# Patient Record
Sex: Male | Born: 1965
Health system: Southern US, Community
[De-identification: ages and names within clinical notes are randomized; demographics above are authoritative.]

## PROBLEM LIST (undated history)

## (undated) DIAGNOSIS — E785 Hyperlipidemia, unspecified: Secondary | ICD-10-CM

## (undated) DIAGNOSIS — I10 Essential (primary) hypertension: Secondary | ICD-10-CM

## (undated) DIAGNOSIS — K219 Gastro-esophageal reflux disease without esophagitis: Secondary | ICD-10-CM

## (undated) DIAGNOSIS — K859 Acute pancreatitis without necrosis or infection, unspecified: Secondary | ICD-10-CM

## (undated) DIAGNOSIS — F1010 Alcohol abuse, uncomplicated: Secondary | ICD-10-CM

## (undated) DIAGNOSIS — N529 Male erectile dysfunction, unspecified: Secondary | ICD-10-CM

## (undated) HISTORY — PX: COLOSTOMY: SHX63

## (undated) HISTORY — PX: NECK SURGERY: SHX720

## (undated) HISTORY — DX: Gastro-esophageal reflux disease without esophagitis: K21.9

## (undated) HISTORY — PX: COLOSTOMY CLOSURE: SHX1381

## (undated) HISTORY — DX: Acute pancreatitis without necrosis or infection, unspecified: K85.90

## (undated) HISTORY — DX: Hyperlipidemia, unspecified: E78.5

## (undated) HISTORY — DX: Male erectile dysfunction, unspecified: N52.9

---

## 2012-11-17 DIAGNOSIS — F1010 Alcohol abuse, uncomplicated: Secondary | ICD-10-CM | POA: Insufficient documentation

## 2013-03-02 DIAGNOSIS — F1490 Cocaine use, unspecified, uncomplicated: Secondary | ICD-10-CM | POA: Insufficient documentation

## 2018-04-22 ENCOUNTER — Encounter: Payer: Self-pay | Admitting: Family Medicine

## 2018-04-22 ENCOUNTER — Ambulatory Visit (INDEPENDENT_AMBULATORY_CARE_PROVIDER_SITE_OTHER): Payer: 59 | Admitting: Family Medicine

## 2018-04-22 VITALS — BP 151/95 | HR 108 | Resp 17 | Ht 74.0 in | Wt 234.0 lb

## 2018-04-22 DIAGNOSIS — Z131 Encounter for screening for diabetes mellitus: Secondary | ICD-10-CM

## 2018-04-22 DIAGNOSIS — N529 Male erectile dysfunction, unspecified: Secondary | ICD-10-CM | POA: Diagnosis not present

## 2018-04-22 DIAGNOSIS — Z7689 Persons encountering health services in other specified circumstances: Secondary | ICD-10-CM

## 2018-04-22 DIAGNOSIS — I10 Essential (primary) hypertension: Secondary | ICD-10-CM

## 2018-04-22 DIAGNOSIS — Z13220 Encounter for screening for lipoid disorders: Secondary | ICD-10-CM

## 2018-04-22 DIAGNOSIS — Z125 Encounter for screening for malignant neoplasm of prostate: Secondary | ICD-10-CM

## 2018-04-22 LAB — POCT URINALYSIS DIP (CLINITEK)
Bilirubin, UA: NEGATIVE
Glucose, UA: NEGATIVE mg/dL
Ketones, POC UA: NEGATIVE mg/dL
Leukocytes, UA: NEGATIVE
Nitrite, UA: NEGATIVE
POC PROTEIN,UA: NEGATIVE
Spec Grav, UA: <=1.005 — AB (ref 1.010–1.025)
Urobilinogen, UA: 0.2 E.U./dL
pH, UA: 6 (ref 5.0–8.0)

## 2018-04-22 MED ORDER — AMLODIPINE BESYLATE 5 MG PO TABS: 5.0000 mg | ORAL_TABLET | Freq: Every day | ORAL | 3 refills | Status: DC

## 2018-04-22 NOTE — Patient Instructions (Addendum)
Thank you for choosing Primary Care at [facility] to be your medical home!    Daniel Silva was seen by Molli Barrows, FNP today.   Daniel Silva's primary care provider is Scot Jun, FNP.   For the best care possible, you should try to see Molli Barrows, FNP-C whenever you come to the clinic.   We look forward to seeing you again soon!  If you have any questions about your visit today, please call us at [phone] or feel free to reach your primary care provider via [place].      Thank you for choosing Primary Care at [facility] for your medical home!    Daniel Silva was seen by Molli Barrows, FNP today.   Daniel Silva's primary care doctor is Scot Jun, FNP.   For the best care possible,  you should try to see Molli Barrows, FNP-C  whenever you come to clinic.   We look forward to seeing you again soon!  If you have any questions about your visit today,  please call us at [phone]  Or feel free to reach your provider via [place].    Hypertension Hypertension, commonly called high blood pressure, is when the force of blood pumping through the arteries is too strong. The arteries are the blood vessels that carry blood from the heart throughout the body. Hypertension forces the heart to work harder to pump blood and may cause arteries to become narrow or stiff. Having untreated or uncontrolled hypertension can cause heart attacks, strokes, kidney disease, and other problems. A blood pressure reading consists of a higher number over a lower number. Ideally, your blood pressure should be below 120/80. The first ("top") number is called the systolic pressure. It is a measure of the pressure in your arteries as your heart beats. The second ("bottom") number is called the diastolic pressure. It is a measure of the pressure in your arteries as the heart relaxes. What are the causes? The cause of this condition is not known. What increases the  risk? Some risk factors for high blood pressure are under your control. Others are not. Factors you can change  Smoking.  Having type 2 diabetes mellitus, high cholesterol, or both.  Not getting enough exercise or physical activity.  Being overweight.  Having too much fat, sugar, calories, or salt (sodium) in your diet.  Drinking too much alcohol. Factors that are difficult or impossible to change  Having chronic kidney disease.  Having a family history of high blood pressure.  Age. Risk increases with age.  Race. You may be at higher risk if you are African-American.  Gender. Men are at higher risk than women before age 75. After age 21, women are at higher risk than men.  Having obstructive sleep apnea.  Stress. What are the signs or symptoms? Extremely high blood pressure (hypertensive crisis) may cause:  Headache.  Anxiety.  Shortness of breath.  Nosebleed.  Nausea and vomiting.  Severe chest pain.  Jerky movements you cannot control (seizures). How is this diagnosed? This condition is diagnosed by measuring your blood pressure while you are seated, with your arm resting on a surface. The cuff of the blood pressure monitor will be placed directly against the skin of your upper arm at the level of your heart. It should be measured at least twice using the same arm. Certain conditions can cause a difference in blood pressure between your right and left arms. Certain factors can cause blood pressure readings to  be lower or higher than normal (elevated) for a short period of time:  When your blood pressure is higher when you are in a health care provider's office than when you are at home, this is called white coat hypertension. Most people with this condition do not need medicines.  When your blood pressure is higher at home than when you are in a health care provider's office, this is called masked hypertension. Most people with this condition may need medicines  to control blood pressure. If you have a high blood pressure reading during one visit or you have normal blood pressure with other risk factors:  You may be asked to return on a different day to have your blood pressure checked again.  You may be asked to monitor your blood pressure at home for 1 week or longer. If you are diagnosed with hypertension, you may have other blood or imaging tests to help your health care provider understand your overall risk for other conditions. How is this treated? This condition is treated by making healthy lifestyle changes, such as eating healthy foods, exercising more, and reducing your alcohol intake. Your health care provider may prescribe medicine if lifestyle changes are not enough to get your blood pressure under control, and if:  Your systolic blood pressure is above 130.  Your diastolic blood pressure is above 80. Your personal target blood pressure may vary depending on your medical conditions, your age, and other factors. Follow these instructions at home: Eating and drinking   Eat a diet that is high in fiber and potassium, and low in sodium, added sugar, and fat. An example eating plan is called the DASH (Dietary Approaches to Stop Hypertension) diet. To eat this way: ? Eat plenty of fresh fruits and vegetables. Try to fill half of your plate at each meal with fruits and vegetables. ? Eat whole grains, such as whole wheat pasta, brown rice, or whole grain bread. Fill about one quarter of your plate with whole grains. ? Eat or drink low-fat dairy products, such as skim milk or low-fat yogurt. ? Avoid fatty cuts of meat, processed or cured meats, and poultry with skin. Fill about one quarter of your plate with lean proteins, such as fish, chicken without skin, beans, eggs, and tofu. ? Avoid premade and processed foods. These tend to be higher in sodium, added sugar, and fat.  Reduce your daily sodium intake. Most people with hypertension should  eat less than 1,500 mg of sodium a day.  Limit alcohol intake to no more than 1 drink a day for nonpregnant women and 2 drinks a day for men. One drink equals 12 oz of beer, 5 oz of wine, or 1 oz of hard liquor. Lifestyle   Work with your health care provider to maintain a healthy body weight or to lose weight. Ask what an ideal weight is for you.  Get at least 30 minutes of exercise that causes your heart to beat faster (aerobic exercise) most days of the week. Activities may include walking, swimming, or biking.  Include exercise to strengthen your muscles (resistance exercise), such as pilates or lifting weights, as part of your weekly exercise routine. Try to do these types of exercises for 30 minutes at least 3 days a week.  Do not use any products that contain nicotine or tobacco, such as cigarettes and e-cigarettes. If you need help quitting, ask your health care provider.  Monitor your blood pressure at home as told by your health  care provider.  Keep all follow-up visits as told by your health care provider. This is important. Medicines  Take over-the-counter and prescription medicines only as told by your health care provider. Follow directions carefully. Blood pressure medicines must be taken as prescribed.  Do not skip doses of blood pressure medicine. Doing this puts you at risk for problems and can make the medicine less effective.  Ask your health care provider about side effects or reactions to medicines that you should watch for. Contact a health care provider if:  You think you are having a reaction to a medicine you are taking.  You have headaches that keep coming back (recurring).  You feel dizzy.  You have swelling in your ankles.  You have trouble with your vision. Get help right away if:  You develop a severe headache or confusion.  You have unusual weakness or numbness.  You feel faint.  You have severe pain in your chest or abdomen.  You vomit  repeatedly.  You have trouble breathing. Summary  Hypertension is when the force of blood pumping through your arteries is too strong. If this condition is not controlled, it may put you at risk for serious complications.  Your personal target blood pressure may vary depending on your medical conditions, your age, and other factors. For most people, a normal blood pressure is less than 120/80.  Hypertension is treated with lifestyle changes, medicines, or a combination of both. Lifestyle changes include weight loss, eating a healthy, low-sodium diet, exercising more, and limiting alcohol. This information is not intended to replace advice given to you by your health care provider. Make sure you discuss any questions you have with your health care provider. Document Released: 02/25/2005 Document Revised: 01/24/2016 Document Reviewed: 01/24/2016 Elsevier Interactive Patient Education  2019 [employer].    Once blood pressure has been well controlled we can discuss starting ED medication if this problem persist in spite of regaining good blood pressure control.  Erectile Dysfunction Erectile dysfunction (ED) is the inability to get or keep an erection in order to have sexual intercourse. Erectile dysfunction may include:  Inability to get an erection.  Lack of enough hardness of the erection to allow penetration.  Loss of the erection before sex is finished. What are the causes? This condition may be caused by:  Certain medicines, such as: ? Pain relievers. ? Antihistamines. ? Antidepressants. ? Blood pressure medicines. ? Water pills (diuretics). ? Ulcer medicines. ? Muscle relaxants. ? Drugs.  Excessive drinking.  Psychological causes, such as: ? Anxiety. ? Depression. ? Sadness. ? Exhaustion. ? Performance fear. ? Stress.  Physical causes, such as: ? Artery problems. This may include diabetes, smoking, liver disease, or atherosclerosis. ? High blood  pressure. ? Hormonal problems, such as low testosterone. ? Obesity. ? Nerve problems. This may include back or pelvic injuries, diabetes mellitus, multiple sclerosis, or Parkinson disease. What are the signs or symptoms? Symptoms of this condition include:  Inability to get an erection.  Lack of enough hardness of the erection to allow penetration.  Loss of the erection before sex is finished.  Normal erections at some times, but with frequent unsatisfactory episodes.  Low sexual satisfaction in either partner due to erection problems.  A curved penis occurring with erection. The curve may cause pain or the penis may be too curved to allow for intercourse.  Never having nighttime erections. How is this diagnosed? This condition is often diagnosed by:  Performing a physical exam to find  other diseases or specific problems with the penis.  Asking you detailed questions about the problem.  Performing blood tests to check for diabetes mellitus or to measure hormone levels.  Performing other tests to check for underlying health conditions.  Performing an ultrasound exam to check for scarring.  Performing a test to check blood flow to the penis.  Doing a sleep study at home to measure nighttime erections. How is this treated? This condition may be treated by:  Medicine taken by mouth to help you achieve an erection (oral medicine).  Hormone replacement therapy to replace low testosterone levels.  Medicine that is injected into the penis. Your health care provider may instruct you how to give yourself these injections at home.  Vacuum pump. This is a pump with a ring on it. The pump and ring are placed on the penis and used to create pressure that helps the penis become erect.  Penile implant surgery. In this procedure, you may receive: ? An inflatable implant. This consists of cylinders, a pump, and a reservoir. The cylinders can be inflated with a fluid that helps to create  an erection, and they can be deflated after intercourse. ? A semi-rigid implant. This consists of two silicone rubber rods. The rods provide some rigidity. They are also flexible, so the penis can both curve downward in its normal position and become straight for sexual intercourse.  Blood vessel surgery, to improve blood flow to the penis. During this procedure, a blood vessel from a different part of the body is placed into the penis to allow blood to flow around (bypass) damaged or blocked blood vessels.  Lifestyle changes, such as exercising more, losing weight, and quitting smoking. Follow these instructions at home: Medicines   Take over-the-counter and prescription medicines only as told by your health care provider. Do not increase the dosage without first discussing it with your health care provider.  If you are using self-injections, perform injections as directed by your health care provider. Make sure to avoid any veins that are on the surface of the penis. After giving an injection, apply pressure to the injection site for 5 minutes. General instructions  Exercise regularly, as directed by your health care provider. Work with your health care provider to lose weight, if needed.  Do not use any products that contain nicotine or tobacco, such as cigarettes and e-cigarettes. If you need help quitting, ask your health care provider.  Before using a vacuum pump, read the instructions that come with the pump and discuss any questions with your health care provider.  Keep all follow-up visits as told by your health care provider. This is important. Contact a health care provider if:  You feel nauseous.  You vomit. Get help right away if:  You are taking oral or injectable medicines and you have an erection that lasts longer than 4 hours. If your health care provider is unavailable, go to the nearest emergency room for evaluation. An erection that lasts much longer than 4 hours can  result in permanent damage to your penis.  You have severe pain in your groin or abdomen.  You develop redness or severe swelling of your penis.  You have redness spreading up into your groin or lower abdomen.  You are unable to urinate.  You experience chest pain or a rapid heart beat (palpitations) after taking oral medicines. Summary  Erectile dysfunction (ED) is the inability to get or keep an erection during sexual intercourse. This problem can  usually be treated successfully.  This condition is diagnosed based on a physical exam, your symptoms, and tests to determine the cause. Treatment varies depending on the cause, and may include medicines, hormone therapy, surgery, or vacuum pump.  You may need follow-up visits to make sure that you are using your medicines or devices correctly.  Get help right away if you are taking or injecting medicines and you have an erection that lasts longer than 4 hours. This information is not intended to replace advice given to you by your health care provider. Make sure you discuss any questions you have with your health care provider. Document Released: 02/23/2000 Document Revised: 03/13/2016 Document Reviewed: 03/13/2016 Elsevier Interactive Patient Education  2019 [employer].

## 2018-04-22 NOTE — Progress Notes (Signed)
Daniel Silva, is a 53 y.o. male  KZL:[id number]  [id number]  DOB - June 24, 1965  CC:  Chief Complaint  Patient presents with  . Establish Care  . Hypertension    went to the dentist to get work done & was told that his BP was high. no past hx of HTN       HPI: Daniel Silva is a 53 y.o. male is here today to establish care and evaluation of new onset hypertension.    Hypertension new Patient presents for evaluation of elevated blood pressure without a diagnosis of hypertension.  Patient went to the dentist to have teeth extracted and serial readings of blood pressure was elevated greater than 140/90.  Procedure was deferred until patient followed up here for further evaluation. Patient is a current light smoker drinks occasionally and active of routine physical activity.  Current BMI Body mass index is 30.04 kg/m.  He is an active routine physical activity. He also has a concern regarding erectile dysfunction.  This problem started approximately 3 months prior.  He has no known history of peripheral vascular disease or diabetes.  He has not had any formal follow-up with a primary care provider previously.  Current medications:No current outpatient medications on file.   Pertinent family medical history: family history includes Hypertension in his mother.   No Known Allergies  Social History   Socioeconomic History  . Marital status: Single    Spouse name: Not on file  . Number of children: Not on file  . Years of education: Not on file  . Highest education level: Not on file  Occupational History  . Not on file  Social Needs  . Financial resource strain: Not on file  . Food insecurity:    Worry: Not on file    Inability: Not on file  . Transportation needs:    Medical: Not on file    Non-medical: Not on file  Tobacco Use  . Smoking status: Current Every Day Smoker  . Smokeless tobacco: Never Used  Substance and Sexual Activity  . Alcohol use: Yes  . Drug use: Not on  file  . Sexual activity: Yes    Partners: Female    Birth control/protection: Post-menopausal  Lifestyle  . Physical activity:    Days per week: Not on file    Minutes per session: Not on file  . Stress: Not on file  Relationships  . Social connections:    Talks on phone: Not on file    Gets together: Not on file    Attends religious service: Not on file    Active member of club or organization: Not on file    Attends meetings of clubs or organizations: Not on file    Relationship status: Not on file  . Intimate partner violence:    Fear of current or ex partner: Not on file    Emotionally abused: Not on file    Physically abused: Not on file    Forced sexual activity: Not on file  Other Topics Concern  . Not on file  Social History Narrative  . Not on file    Review of Systems: Pertinent negatives listed in HPI Objective:   Vitals:   04/22/18 0918  BP: (!) 151/95  Pulse: (!) 108  Resp: 17  SpO2: 96%    BP Readings from Last 3 Encounters:  04/22/18 (!) 151/95    Filed Weights   04/22/18 0918  Weight: 234 lb (106.1 kg)  Physical Exam: Constitutional: Patient appears well-developed and well-nourished. No distress. HENT: Normocephalic, atraumatic, External right and left ear normal.  Eyes: Conjunctivae and EOM are normal. PERRLA, no scleral icterus. Neck: Normal ROM. Neck supple. No JVD. No tracheal deviation. No thyromegaly. CVS: RRR, S1/S2 +, no murmurs, no gallops, no carotid bruit.  Pulmonary: Effort and breath sounds normal, no stridor, rhonchi, wheezes, rales.  Abdominal: Soft. BS +, no distension, tenderness, rebound or guarding.  Musculoskeletal: Normal range of motion. No edema and no tenderness.  Neuro: Alert. Normal muscle tone coordination. Normal gait. BUE and BLE strength 5/5. Bilateral hand grips symmetrical. Skin: Skin is warm and dry. No rash noted. Not diaphoretic. No erythema. No pallor. Psychiatric: Normal mood and affect. Behavior,  judgment, thought content normal.   Assessment and plan:  1. Encounter to establish care 2. Essential hypertension, New  Will trial amlodipine 5 mg once daily Return in 4 weeks for a blood pressure check   Checking:  - Comprehensive metabolic panel - TSH - CBC with Differential - POCT URINALYSIS DIP (CLINITEK)  3. Screening for diabetes mellitus - Hemoglobin A1c  4. Screening, lipid - Lipid panel; Future  5. Erectile dysfunction, unspecified erectile dysfunction type -Will postpone treatment and evaluation as achieving BP control is most prevalent today. Patient will follow-up in 4 weeks to discuss further   6. Screening PSA (prostate specific antigen) - PSA   Return in about 4 weeks (around 05/20/2018) for Hypertension and Erectile Dysfunction Follow-up .   The patient was given clear instructions to go to ER or return to medical center if symptoms don't improve, worsen or new problems develop. The patient verbalized understanding. The patient was advised  to call and obtain lab results if they haven't heard anything from out office within 7-10 business days.  Molli Barrows, FNP-C Primary Care at [facility] [address] [place] [address] [phone]: [phone]    This note has been created with Dragon speech recognition software and Engineer, materials. Any transcriptional errors are unintentional.

## 2018-04-23 ENCOUNTER — Ambulatory Visit: Payer: 59

## 2018-04-23 LAB — CBC WITH DIFFERENTIAL/PLATELET
Basophils Absolute: 0 10*3/uL (ref 0.0–0.2)
Basos: 0 %
EOS (ABSOLUTE): 0.5 10*3/uL — ABNORMAL HIGH (ref 0.0–0.4)
Eos: 6 %
Hematocrit: 41.3 % (ref 37.5–51.0)
Hemoglobin: 13.9 g/dL (ref 13.0–17.7)
IMMATURE GRANULOCYTES: 0 %
Immature Grans (Abs): 0 10*3/uL (ref 0.0–0.1)
Lymphocytes Absolute: 3.5 10*3/uL — ABNORMAL HIGH (ref 0.7–3.1)
Lymphs: 40 %
MCH: 30.5 pg (ref 26.6–33.0)
MCHC: 33.7 g/dL (ref 31.5–35.7)
MCV: 91 fL (ref 79–97)
MONOS ABS: 0.8 10*3/uL (ref 0.1–0.9)
Monocytes: 9 %
Neutrophils Absolute: 4 10*3/uL (ref 1.4–7.0)
Neutrophils: 45 %
Platelets: 258 10*3/uL (ref 150–450)
RBC: 4.56 x10E6/uL (ref 4.14–5.80)
RDW: 12.8 % (ref 11.6–15.4)
WBC: 8.9 10*3/uL (ref 3.4–10.8)

## 2018-04-23 LAB — COMPREHENSIVE METABOLIC PANEL
A/G RATIO: 1.6 (ref 1.2–2.2)
ALT: 37 IU/L (ref 0–44)
AST: 32 IU/L (ref 0–40)
Albumin: 4.4 g/dL (ref 3.8–4.9)
Alkaline Phosphatase: 91 IU/L (ref 39–117)
BUN/Creatinine Ratio: 9 (ref 9–20)
BUN: 9 mg/dL (ref 6–24)
Bilirubin Total: 0.4 mg/dL (ref 0.0–1.2)
CO2: 16 mmol/L — ABNORMAL LOW (ref 20–29)
Calcium: 9.8 mg/dL (ref 8.7–10.2)
Chloride: 105 mmol/L (ref 96–106)
Creatinine, Ser: 1.05 mg/dL (ref 0.76–1.27)
GFR calc Af Amer: 94 mL/min/{1.73_m2} (ref >59)
GFR calc non Af Amer: 81 mL/min/{1.73_m2} (ref >59)
Globulin, Total: 2.7 g/dL (ref 1.5–4.5)
Glucose: 120 mg/dL — ABNORMAL HIGH (ref 65–99)
POTASSIUM: 3.6 mmol/L (ref 3.5–5.2)
Sodium: 143 mmol/L (ref 134–144)
Total Protein: 7.1 g/dL (ref 6.0–8.5)

## 2018-04-23 LAB — PSA: Prostate Specific Ag, Serum: 1.2 ng/mL (ref 0.0–4.0)

## 2018-04-23 LAB — TSH: TSH: 1.93 u[IU]/mL (ref 0.450–4.500)

## 2018-04-23 LAB — HEMOGLOBIN A1C
Est. average glucose Bld gHb Est-mCnc: 108 mg/dL
Hgb A1c MFr Bld: 5.4 % (ref 4.8–5.6)

## 2018-04-27 ENCOUNTER — Ambulatory Visit: Payer: 59

## 2018-04-30 ENCOUNTER — Ambulatory Visit (INDEPENDENT_AMBULATORY_CARE_PROVIDER_SITE_OTHER): Payer: 59

## 2018-04-30 VITALS — BP 128/87 | HR 90

## 2018-04-30 DIAGNOSIS — I10 Essential (primary) hypertension: Secondary | ICD-10-CM

## 2018-04-30 DIAGNOSIS — Z13220 Encounter for screening for lipoid disorders: Secondary | ICD-10-CM | POA: Diagnosis not present

## 2018-04-30 NOTE — Progress Notes (Signed)
Patient here for BP check. BP 128/87. Pulse 90. Spoke with provider. She states to continue current medication & follow up with her in 4 weeks. KWalker, CMA.

## 2018-05-01 LAB — LIPID PANEL
Chol/HDL Ratio: 5.6 ratio — ABNORMAL HIGH (ref 0.0–5.0)
Cholesterol, Total: 195 mg/dL (ref 100–199)
HDL: 35 mg/dL — ABNORMAL LOW (ref >39)
Triglycerides: 420 mg/dL — ABNORMAL HIGH (ref 0–149)

## 2018-05-01 NOTE — Progress Notes (Signed)
Patient notified of results & recommendations during appointment for his BP check. Expressed understanding.

## 2018-05-04 ENCOUNTER — Other Ambulatory Visit: Payer: Self-pay | Admitting: Family Medicine

## 2018-05-04 MED ORDER — ATORVASTATIN CALCIUM 10 MG PO TABS: 10.0000 mg | ORAL_TABLET | Freq: Every day | ORAL | 3 refills | Status: DC

## 2018-05-04 NOTE — Telephone Encounter (Signed)
Contact patient to advise recent cholesterol panel indicates abnormally elevated triglycerides and low good cholesterol (HDL) level. These levels given his recent diagnosis of hypertension,  increases his risk of a cardiovascular event, therefore I am placing him on statin therapy. Atorvastatin 10 mg once daily with dinner at 6 pm. Will need to see him back in office in 8 weeks to repeat lipids, CMP, and hypertension follow-up.

## 2018-05-04 NOTE — Telephone Encounter (Signed)
Patient called stating that last time he was in to see PCP she was going to give him something for his erectile dysfunction, patient would like a medication sent to the pharmacy.

## 2018-05-06 NOTE — Telephone Encounter (Signed)
Left voice mail to call back 

## 2018-05-28 ENCOUNTER — Ambulatory Visit (INDEPENDENT_AMBULATORY_CARE_PROVIDER_SITE_OTHER): Payer: 59 | Admitting: Family Medicine

## 2018-05-28 ENCOUNTER — Other Ambulatory Visit: Payer: Self-pay

## 2018-05-28 ENCOUNTER — Encounter: Payer: Self-pay | Admitting: Family Medicine

## 2018-05-28 VITALS — BP 148/107 | HR 100 | Temp 98.6°F | Resp 16 | Ht 74.0 in | Wt 233.6 lb

## 2018-05-28 DIAGNOSIS — R7989 Other specified abnormal findings of blood chemistry: Secondary | ICD-10-CM

## 2018-05-28 DIAGNOSIS — E291 Testicular hypofunction: Secondary | ICD-10-CM

## 2018-05-28 DIAGNOSIS — N529 Male erectile dysfunction, unspecified: Secondary | ICD-10-CM

## 2018-05-28 DIAGNOSIS — I10 Essential (primary) hypertension: Secondary | ICD-10-CM | POA: Diagnosis not present

## 2018-05-28 LAB — POCT URINALYSIS DIP (CLINITEK)
BILIRUBIN UA: NEGATIVE mg/dL
Bilirubin, UA: NEGATIVE
Glucose, UA: NEGATIVE mg/dL
Leukocytes, UA: NEGATIVE
Nitrite, UA: NEGATIVE
POC PROTEIN,UA: NEGATIVE
SPEC GRAV UA: 1.025 (ref 1.010–1.025)
Urobilinogen, UA: 0.2 E.U./dL
pH, UA: 7 (ref 5.0–8.0)

## 2018-05-28 MED ORDER — AMLODIPINE BESYLATE 5 MG PO TABS: 10.0000 mg | ORAL_TABLET | Freq: Every day | ORAL | 3 refills | Status: DC

## 2018-05-28 MED ORDER — SILDENAFIL CITRATE 100 MG PO TABS: 50.0000 mg | ORAL_TABLET | Freq: Every day | ORAL | 2 refills | Status: DC | PRN

## 2018-05-28 NOTE — Patient Instructions (Signed)
I have increased your amlodipine to 10 mg once daily your blood pressure is elevated today. Education added sildenafil 100 mg take half a tab 1 hour prior to sexual intercourse. You will be notified of any abnormal lab values.     Erectile Dysfunction Erectile dysfunction (ED) is the inability to get or keep an erection in order to have sexual intercourse. Erectile dysfunction may include:  Inability to get an erection.  Lack of enough hardness of the erection to allow penetration.  Loss of the erection before sex is finished. What are the causes? This condition may be caused by:  Certain medicines, such as: ? Pain relievers. ? Antihistamines. ? Antidepressants. ? Blood pressure medicines. ? Water pills (diuretics). ? Ulcer medicines. ? Muscle relaxants. ? Drugs.  Excessive drinking.  Psychological causes, such as: ? Anxiety. ? Depression. ? Sadness. ? Exhaustion. ? Performance fear. ? Stress.  Physical causes, such as: ? Artery problems. This may include diabetes, smoking, liver disease, or atherosclerosis. ? High blood pressure. ? Hormonal problems, such as low testosterone. ? Obesity. ? Nerve problems. This may include back or pelvic injuries, diabetes mellitus, multiple sclerosis, or Parkinson disease. What are the signs or symptoms? Symptoms of this condition include:  Inability to get an erection.  Lack of enough hardness of the erection to allow penetration.  Loss of the erection before sex is finished.  Normal erections at some times, but with frequent unsatisfactory episodes.  Low sexual satisfaction in either partner due to erection problems.  A curved penis occurring with erection. The curve may cause pain or the penis may be too curved to allow for intercourse.  Never having nighttime erections. How is this diagnosed? This condition is often diagnosed by:  Performing a physical exam to find other diseases or specific problems with the penis.   Asking you detailed questions about the problem.  Performing blood tests to check for diabetes mellitus or to measure hormone levels.  Performing other tests to check for underlying health conditions.  Performing an ultrasound exam to check for scarring.  Performing a test to check blood flow to the penis.  Doing a sleep study at home to measure nighttime erections. How is this treated? This condition may be treated by:  Medicine taken by mouth to help you achieve an erection (oral medicine).  Hormone replacement therapy to replace low testosterone levels.  Medicine that is injected into the penis. Your health care provider may instruct you how to give yourself these injections at home.  Vacuum pump. This is a pump with a ring on it. The pump and ring are placed on the penis and used to create pressure that helps the penis become erect.  Penile implant surgery. In this procedure, you may receive: ? An inflatable implant. This consists of cylinders, a pump, and a reservoir. The cylinders can be inflated with a fluid that helps to create an erection, and they can be deflated after intercourse. ? A semi-rigid implant. This consists of two silicone rubber rods. The rods provide some rigidity. They are also flexible, so the penis can both curve downward in its normal position and become straight for sexual intercourse.  Blood vessel surgery, to improve blood flow to the penis. During this procedure, a blood vessel from a different part of the body is placed into the penis to allow blood to flow around (bypass) damaged or blocked blood vessels.  Lifestyle changes, such as exercising more, losing weight, and quitting smoking. Follow these instructions  at home: Medicines   Take over-the-counter and prescription medicines only as told by your health care provider. Do not increase the dosage without first discussing it with your health care provider.  If you are using self-injections,  perform injections as directed by your health care provider. Make sure to avoid any veins that are on the surface of the penis. After giving an injection, apply pressure to the injection site for 5 minutes. General instructions  Exercise regularly, as directed by your health care provider. Work with your health care provider to lose weight, if needed.  Do not use any products that contain nicotine or tobacco, such as cigarettes and e-cigarettes. If you need help quitting, ask your health care provider.  Before using a vacuum pump, read the instructions that come with the pump and discuss any questions with your health care provider.  Keep all follow-up visits as told by your health care provider. This is important. Contact a health care provider if:  You feel nauseous.  You vomit. Get help right away if:  You are taking oral or injectable medicines and you have an erection that lasts longer than 4 hours. If your health care provider is unavailable, go to the nearest emergency room for evaluation. An erection that lasts much longer than 4 hours can result in permanent damage to your penis.  You have severe pain in your groin or abdomen.  You develop redness or severe swelling of your penis.  You have redness spreading up into your groin or lower abdomen.  You are unable to urinate.  You experience chest pain or a rapid heart beat (palpitations) after taking oral medicines. Summary  Erectile dysfunction (ED) is the inability to get or keep an erection during sexual intercourse. This problem can usually be treated successfully.  This condition is diagnosed based on a physical exam, your symptoms, and tests to determine the cause. Treatment varies depending on the cause, and may include medicines, hormone therapy, surgery, or vacuum pump.  You may need follow-up visits to make sure that you are using your medicines or devices correctly.  Get help right away if you are taking or  injecting medicines and you have an erection that lasts longer than 4 hours. This information is not intended to replace advice given to you by your health care provider. Make sure you discuss any questions you have with your health care provider. Document Released: 02/23/2000 Document Revised: 03/13/2016 Document Reviewed: 03/13/2016 Elsevier Interactive Patient Education  2019 [employer].

## 2018-05-28 NOTE — Progress Notes (Signed)
Patient ID: Daniel Silva, male    DOB: Jun 04, 1965, 53 y.o.   MRN: [id number]  PCP: Scot Jun, FNP  Chief Complaint  Patient presents with  . Follow-up    Subjective:  HPI  Daniel Silva is a 53 y.o. male presents for evaluation of erectile dysfunction.  Daniel Silva has Erectile dysfunction; Essential hypertension; and Low testosterone in male.  Newly diagnosed with hypertension, Daniel Silva complains of over 6 months of ED symptoms. He reports difficulty maintaining an erection only. He is soon to be married and would like to start ED medications. This has never been a problem previously. He has a normal PSA level. No prior testerone level evaluation. He is a smoker. He doesn't have diabetes. Reports compliance with blood pressure medications. Social History   Socioeconomic History  . Marital status: Single    Spouse name: Not on file  . Number of children: Not on file  . Years of education: Not on file  . Highest education level: Not on file  Occupational History  . Not on file  Social Needs  . Financial resource strain: Not on file  . Food insecurity:    Worry: Not on file    Inability: Not on file  . Transportation needs:    Medical: Not on file    Non-medical: Not on file  Tobacco Use  . Smoking status: Current Every Day Smoker    Types: Cigarettes  . Smokeless tobacco: Never Used  . Tobacco comment: 1 cig/day  Substance and Sexual Activity  . Alcohol use: Yes  . Drug use: Not Currently  . Sexual activity: Yes    Partners: Female  Lifestyle  . Physical activity:    Days per week: Not on file    Minutes per session: Not on file  . Stress: Not on file  Relationships  . Social connections:    Talks on phone: Not on file    Gets together: Not on file    Attends religious service: Not on file    Active member of club or organization: Not on file    Attends meetings of clubs or organizations: Not on file    Relationship status: Not on file  . Intimate  partner violence:    Fear of current or ex partner: Not on file    Emotionally abused: Not on file    Physically abused: Not on file    Forced sexual activity: Not on file  Other Topics Concern  . Not on file  Social History Narrative  . Not on file    Family History  Problem Relation Age of Onset  . Hypertension Mother   . Diabetes Neg Hx   . Hyperlipidemia Neg Hx   . Heart disease Neg Hx      Review of Systems Pertinent negatives listed in HPI No Known Allergies  Prior to Admission medications   Medication Sig Start Date End Date Taking? Authorizing Provider  amLODipine (NORVASC) 5 MG tablet Take 2 tablets (10 mg total) by mouth daily. 05/28/18  Yes Scot Jun, FNP  atorvastatin (LIPITOR) 10 MG tablet Take 1 tablet (10 mg total) by mouth daily. 05/04/18  Yes Scot Jun, FNP  ofloxacin (OCUFLOX) 0.3 % ophthalmic solution Place 1 drop into both eyes 4 (four) times daily.   Yes [provider]  VALACYCLOVIR HCL PO Take by mouth.   Yes [provider]  sildenafil (VIAGRA) 100 MG tablet Take 0.5 tablets (50 mg total) by mouth daily as needed  for erectile dysfunction. 05/28/18   Scot Jun, FNP    Past Medical, Surgical Family and Social History reviewed and updated.    Objective:   Today's Vitals   05/28/18 0900  BP: (!) 148/107  Pulse: 100  Resp: 16  Temp: 98.6 F (37 C)  TempSrc: Oral  SpO2: 96%  Weight: 233 lb 9.6 oz (106 kg)  Height: 6\' 2"  (1.88 m)  PainSc: 0-No pain    BP Readings from Last 3 Encounters:  05/28/18 (!) 148/107  04/30/18 128/87  04/22/18 (!) 151/95    Filed Weights   05/28/18 0900  Weight: 233 lb 9.6 oz (106 kg)       Physical Exam General appearance: alert, well developed, well nourished, cooperative and in no distress Head: Normocephalic, without obvious abnormality, atraumatic Respiratory: Respirations even and unlabored, normal respiratory rate Heart: rate and rhythm normal. No gallop or  murmurs noted on exam  Abdomen: BS +, no distention, no rebound tenderness, or no mass Extremities: No gross deformities Skin: Skin color, texture, turgor normal. No rashes seen  Psych: Appropriate mood and affect. Neurologic: Alert, oriented to person, place, and time, thought content appropriate. No results found for: POCGLU  Lab Results  Component Value Date   HGBA1C 5.4 04/22/2018    Assessment & Plan:  1. Erectile dysfunction, unspecified erectile dysfunction type Trial sildenafil 50 mg once daily as needed prior to sexual activity. - Testosterone Free, Profile I  2. Essential hypertension We have discussed target BP range and blood pressure goal. I have advised patient to check BP regularly and to call us back or report to clinic if the numbers are consistently higher than 140/90. We discussed the importance of compliance with medical therapy and DASH diet recommended, consequences of uncontrolled hypertension discussed.  - continue current BP medications -Increase amlodipine 10 mg once daily  - POCT URINALYSIS DIP (CLINITEK)    Molli Barrows, FNP Primary Care at [facility] [address], [place] [phone]: [phone]

## 2018-05-28 NOTE — Progress Notes (Deleted)
Follow up - HTN                    Discuss ED

## 2018-05-28 NOTE — Progress Notes (Deleted)
Patient ID: Daniel Silva, male    DOB: September 15, 1965, 53 y.o.   MRN: [id number]  PCP: Scot Jun, FNP  No chief complaint on file.   Subjective:  HPI  Daniel Silva is a 53 y.o. male presents for evaluation erectile dysfunction.   Erectile Dysfunction:  Patient complains of erectile dysfunction.  Onset of dysfunction was {0-10:33138} {units:11} ago and was {onset:31634} in onset.  Patient states the nature of difficulty is {ED difficulty:16474}. Full erections occur {erections:16472}. Partial erections occur {erections:16472}. Libido {is/not:9024} affected. Risk factors for ED include {risk factors ED:17914}. Patient denies history of {risk factors ED:17914}. Patient's expectations as to sexual function ***.  Patient's description of relationship w/partner ***.  Previous treatment of ED includes ***.      Today's visit:  Social History   Socioeconomic History  . Marital status: Single    Spouse name: Not on file  . Number of children: Not on file  . Years of education: Not on file  . Highest education level: Not on file  Occupational History  . Not on file  Social Needs  . Financial resource strain: Not on file  . Food insecurity:    Worry: Not on file    Inability: Not on file  . Transportation needs:    Medical: Not on file    Non-medical: Not on file  Tobacco Use  . Smoking status: Current Every Day Smoker  . Smokeless tobacco: Never Used  Substance and Sexual Activity  . Alcohol use: Yes  . Drug use: Not on file  . Sexual activity: Yes    Partners: Female    Birth control/protection: Post-menopausal  Lifestyle  . Physical activity:    Days per week: Not on file    Minutes per session: Not on file  . Stress: Not on file  Relationships  . Social connections:    Talks on phone: Not on file    Gets together: Not on file    Attends religious service: Not on file    Active member of club or organization: Not on file    Attends meetings of clubs or  organizations: Not on file    Relationship status: Not on file  . Intimate partner violence:    Fear of current or ex partner: Not on file    Emotionally abused: Not on file    Physically abused: Not on file    Forced sexual activity: Not on file  Other Topics Concern  . Not on file  Social History Narrative  . Not on file    Family History  Problem Relation Age of Onset  . Hypertension Mother   . Diabetes Neg Hx   . Hyperlipidemia Neg Hx   . Heart disease Neg Hx      Review of Systems  No Known Allergies  Prior to Admission medications   Medication Sig Start Date End Date Taking? Authorizing Provider  amLODipine (NORVASC) 5 MG tablet Take 1 tablet (5 mg total) by mouth daily. 04/22/18   Scot Jun, FNP  atorvastatin (LIPITOR) 10 MG tablet Take 1 tablet (10 mg total) by mouth daily. 05/04/18   Scot Jun, FNP    Past Medical, Surgical Family and Social History reviewed and updated.    Objective:  There were no vitals filed for this visit.  BP Readings from Last 3 Encounters:  04/30/18 128/87  04/22/18 (!) 151/95    There were no vitals filed for this visit.     Physical Exam  General appearance: alert, well developed, well nourished, cooperative and in no distress Head: Normocephalic, without obvious abnormality, atraumatic Respiratory: Respirations even and unlabored, normal respiratory rate Heart: rate and rhythm normal. No gallop or murmurs noted on exam  Extremities: No gross deformities Skin: Skin color, texture, turgor normal. No rashes seen  Psych: Appropriate mood and affect. Neurologic: Mental status: Alert, oriented to person, place, and time, thought content appropriate.  No results found for: POCGLU  Lab Results  Component Value Date   HGBA1C 5.4 04/22/2018            Assessment & Plan:  1. Erectile dysfunction, unspecified erectile dysfunction type ***   2. Hypertension follow-up      Daniel Barrows, FNP Primary  Care at [facility] [address], [place] [phone]: [phone]

## 2018-05-29 LAB — TESTOSTERONE FREE, PROFILE I
Albumin: 4.4 g/dL (ref 3.8–4.9)
Sex Hormone Binding: 17.3 nmol/L — ABNORMAL LOW (ref 19.3–76.4)
Testost., Free, Calc: 23.8 pg/mL — ABNORMAL LOW (ref 35.8–168.2)
Testosterone: 95 ng/dL — ABNORMAL LOW (ref 264–916)

## 2018-05-31 ENCOUNTER — Encounter: Payer: Self-pay | Admitting: Family Medicine

## 2018-05-31 DIAGNOSIS — R7989 Other specified abnormal findings of blood chemistry: Secondary | ICD-10-CM | POA: Insufficient documentation

## 2018-05-31 DIAGNOSIS — I10 Essential (primary) hypertension: Secondary | ICD-10-CM | POA: Insufficient documentation

## 2018-05-31 DIAGNOSIS — N529 Male erectile dysfunction, unspecified: Secondary | ICD-10-CM | POA: Insufficient documentation

## 2018-06-01 ENCOUNTER — Other Ambulatory Visit: Payer: Self-pay | Admitting: Family Medicine

## 2018-06-01 NOTE — Telephone Encounter (Signed)
Contact patient to advise that recent testosterone labs indicated a testosterone deficiency which is likely the cause of his ED symptoms.  I would like for him to start testosterone replacement with testosterone gel applications once daily if patient is in agreement release medication to pharmacy.   I will repeat testosterone level, check PSA level, and CBC at next follow-up. Please ensure that his follow-up appointment is schedule between 8:30 and prior to 10:00 am in order for labs to be accurate

## 2018-06-02 NOTE — Telephone Encounter (Signed)
Patient called returning call from nurse, please follow up.

## 2018-06-02 NOTE — Telephone Encounter (Signed)
Left voice mail to call back 

## 2018-06-03 MED ORDER — TESTOSTERONE 50 MG/5GM (1%) TD GEL: 5.0000 g | Freq: Every day | TRANSDERMAL | 3 refills | Status: DC

## 2018-06-03 NOTE — Telephone Encounter (Signed)
I did verify patient's pharmacy but I am unable to release Rx since it is a controlled medication. Can you please send? Thanks.

## 2018-06-03 NOTE — Telephone Encounter (Signed)
Patient notified of lab results & recommendations. Expressed understanding. Patient's follow up appointment is scheduled at 8:30 AM so it is in the window to recheck hormone levels.

## 2018-07-26 ENCOUNTER — Emergency Department (HOSPITAL_COMMUNITY): Payer: 59

## 2018-07-26 ENCOUNTER — Other Ambulatory Visit: Payer: Self-pay

## 2018-07-26 ENCOUNTER — Emergency Department (HOSPITAL_COMMUNITY)
Admission: EM | Admit: 2018-07-26 | Discharge: 2018-07-26 | Disposition: A | Discharge status: Home / Self Care | Payer: 59 | Attending: Emergency Medicine | Admitting: Emergency Medicine

## 2018-07-26 DIAGNOSIS — R0789 Other chest pain: Secondary | ICD-10-CM | POA: Diagnosis not present

## 2018-07-26 DIAGNOSIS — Z79899 Other long term (current) drug therapy: Secondary | ICD-10-CM | POA: Insufficient documentation

## 2018-07-26 DIAGNOSIS — R079 Chest pain, unspecified: Secondary | ICD-10-CM | POA: Diagnosis present

## 2018-07-26 DIAGNOSIS — F17210 Nicotine dependence, cigarettes, uncomplicated: Secondary | ICD-10-CM | POA: Insufficient documentation

## 2018-07-26 DIAGNOSIS — I10 Essential (primary) hypertension: Secondary | ICD-10-CM | POA: Insufficient documentation

## 2018-07-26 DIAGNOSIS — R002 Palpitations: Secondary | ICD-10-CM | POA: Diagnosis not present

## 2018-07-26 DIAGNOSIS — R0602 Shortness of breath: Secondary | ICD-10-CM | POA: Insufficient documentation

## 2018-07-26 LAB — RAPID URINE DRUG SCREEN, HOSP PERFORMED
Amphetamines: NOT DETECTED
Barbiturates: NOT DETECTED
Benzodiazepines: NOT DETECTED
Cocaine: POSITIVE — AB
Opiates: NOT DETECTED
Tetrahydrocannabinol: NOT DETECTED

## 2018-07-26 LAB — BASIC METABOLIC PANEL
Anion gap: 11 (ref 5–15)
BUN: 10 mg/dL (ref 6–20)
CO2: 23 mmol/L (ref 22–32)
Calcium: 8.9 mg/dL (ref 8.9–10.3)
Chloride: 109 mmol/L (ref 98–111)
Creatinine, Ser: 1.02 mg/dL (ref 0.61–1.24)
GFR calc Af Amer: >60 mL/min (ref >60)
GFR calc non Af Amer: >60 mL/min (ref >60)
Glucose, Bld: 131 mg/dL — ABNORMAL HIGH (ref 70–99)
Potassium: 2.9 mmol/L — ABNORMAL LOW (ref 3.5–5.1)
Sodium: 143 mmol/L (ref 135–145)

## 2018-07-26 LAB — URINALYSIS, ROUTINE W REFLEX MICROSCOPIC
Bacteria, UA: NONE SEEN
Bilirubin Urine: NEGATIVE
Glucose, UA: NEGATIVE mg/dL
Ketones, ur: NEGATIVE mg/dL
Leukocytes,Ua: NEGATIVE
Nitrite: NEGATIVE
Protein, ur: NEGATIVE mg/dL
Specific Gravity, Urine: 1.01 (ref 1.005–1.030)
pH: 6 (ref 5.0–8.0)

## 2018-07-26 LAB — BRAIN NATRIURETIC PEPTIDE: B Natriuretic Peptide: 20.1 pg/mL (ref 0.0–100.0)

## 2018-07-26 LAB — CBC
HCT: 41.8 % (ref 39.0–52.0)
Hemoglobin: 13.6 g/dL (ref 13.0–17.0)
MCH: 30.5 pg (ref 26.0–34.0)
MCHC: 32.5 g/dL (ref 30.0–36.0)
MCV: 93.7 fL (ref 80.0–100.0)
Platelets: 282 10*3/uL (ref 150–400)
RBC: 4.46 MIL/uL (ref 4.22–5.81)
RDW: 13.8 % (ref 11.5–15.5)
WBC: 10.7 10*3/uL — ABNORMAL HIGH (ref 4.0–10.5)
nRBC: 0 % (ref 0.0–0.2)

## 2018-07-26 LAB — D-DIMER, QUANTITATIVE: D-Dimer, Quant: 0.37 ug/mL-FEU (ref 0.00–0.50)

## 2018-07-26 LAB — TROPONIN I
Troponin I: <0.03 ng/mL (ref <0.03)
Troponin I: <0.03 ng/mL (ref <0.03)

## 2018-07-26 MED ORDER — POTASSIUM CHLORIDE CRYS ER 20 MEQ PO TBCR
40.0000 meq | EXTENDED_RELEASE_TABLET | Freq: Once | ORAL | Status: AC
  Administered 2018-07-26: 40 meq via ORAL
  Filled 2018-07-26: qty 2

## 2018-07-26 MED ORDER — POTASSIUM CHLORIDE 10 MEQ/100ML IV SOLN
10.0000 meq | Freq: Once | INTRAVENOUS | Status: AC
  Administered 2018-07-26: 10 meq via INTRAVENOUS
  Filled 2018-07-26: qty 100

## 2018-07-26 MED ORDER — SODIUM CHLORIDE 0.9 % IV BOLUS
500.0000 mL | Freq: Once | INTRAVENOUS | Status: AC
  Administered 2018-07-26: 500 mL via INTRAVENOUS

## 2018-07-26 MED ORDER — SODIUM CHLORIDE 0.9 % IV BOLUS: 1000.0000 mL | Freq: Once | INTRAVENOUS | Status: DC

## 2018-07-26 MED ORDER — AMLODIPINE BESYLATE 5 MG PO TABS
5.0000 mg | ORAL_TABLET | Freq: Once | ORAL | Status: AC
  Administered 2018-07-26: 5 mg via ORAL
  Filled 2018-07-26: qty 1

## 2018-07-26 MED ORDER — SODIUM CHLORIDE 0.9% FLUSH
3.0000 mL | Freq: Once | INTRAVENOUS | Status: AC
  Administered 2018-07-26: 3 mL via INTRAVENOUS

## 2018-07-26 MED ORDER — ALUM & MAG HYDROXIDE-SIMETH 200-200-20 MG/5ML PO SUSP
30.0000 mL | Freq: Once | ORAL | Status: AC
  Administered 2018-07-26: 21:00:00 30 mL via ORAL
  Filled 2018-07-26: qty 30

## 2018-07-26 MED ORDER — LIDOCAINE VISCOUS HCL 2 % MT SOLN
15.0000 mL | Freq: Once | OROMUCOSAL | Status: AC
  Administered 2018-07-26: 15 mL via ORAL
  Filled 2018-07-26: qty 15

## 2018-07-26 NOTE — ED Provider Notes (Signed)
[facility] DEPT Provider Note   CSN: [id number] Arrival date & time: 07/26/18  1349    History   Chief Complaint Chief Complaint  Patient presents with  . Chest Pain    HPI Daniel Silva is a 53 y.o. male.     53 y.o male with a PMH of HTN, HLD presents to the ED with a chief complaint of chest pain x 4 hours ago. Patient reports a constant intermittent pressure to the center of his chest with no radiation. States this pain is worse with lying down but better by leaning forward. He reports snorting some cocaine on Friday but denies any IV drug use. He also endorses shortness of breath worse with ambulation and better at rest. Has not tried any medication to help with his symptoms. He denies any nausea, vomiting, fever, abdominal pain or back pain.    Chest Pain  Pain location:  Substernal area Pain quality: pressure   Pain radiates to:  Does not radiate Pain severity:  Moderate Onset quality:  Sudden Duration:  4 hours Timing:  Constant Progression:  Unchanged Chronicity:  New Context: drug use and at rest   Relieved by:  Nothing Worsened by:  Nothing Ineffective treatments:  None tried Associated symptoms: palpitations and shortness of breath   Associated symptoms: no anxiety, no cough, no diaphoresis, no fever and no numbness   Risk factors: hypertension and male sex     No past medical history on file.  Patient Active Problem List   Diagnosis Date Noted  . Erectile dysfunction 05/31/2018  . Essential hypertension 05/31/2018  . Low testosterone in male 05/31/2018    Past Surgical History:  Procedure Laterality Date  . NECK SURGERY          Home Medications    Prior to Admission medications   Medication Sig Start Date End Date Taking? Authorizing Provider  amLODipine (NORVASC) 5 MG tablet Take 2 tablets (10 mg total) by mouth daily. 05/28/18  Yes Scot Jun, FNP  atorvastatin (LIPITOR) 10 MG tablet Take 1 tablet  (10 mg total) by mouth daily. 05/04/18  Yes Scot Jun, FNP  testosterone (ANDROGEL) 50 MG/5GM (1%) GEL Place 5 g onto the skin daily for 30 days. 06/03/18 07/26/18 Yes Scot Jun, FNP    Family History Family History  Problem Relation Age of Onset  . Hypertension Mother   . Diabetes Neg Hx   . Hyperlipidemia Neg Hx   . Heart disease Neg Hx     Social History Social History   Tobacco Use  . Smoking status: Current Every Day Smoker    Types: Cigarettes  . Smokeless tobacco: Never Used  . Tobacco comment: 1 cig/day  Substance Use Topics  . Alcohol use: Yes  . Drug use: Not Currently     Allergies   Patient has no known allergies.   Review of Systems Review of Systems  Constitutional: Negative for diaphoresis and fever.  Respiratory: Positive for shortness of breath. Negative for cough.   Cardiovascular: Positive for chest pain and palpitations.  Neurological: Negative for numbness.  All other systems reviewed and are negative.    Physical Exam Updated Vital Signs BP (!) 168/118   Pulse (!) 106   Temp 99.6 F (37.6 C) (Oral)   Resp 18   Ht 6' (1.829 m)   Wt 104.3 kg   SpO2 96%   BMI 31.19 kg/m   Physical Exam Vitals signs and nursing note reviewed.  Constitutional:      Appearance: He is well-developed and normal weight.  HENT:     Head: Normocephalic and atraumatic.  Eyes:     General: No scleral icterus.    Pupils: Pupils are equal, round, and reactive to light.  Neck:     Musculoskeletal: Normal range of motion.  Cardiovascular:     Rate and Rhythm: Tachycardia present.     Heart sounds: Normal heart sounds. No murmur.     Comments: No pitting edema.  Pulmonary:     Effort: Pulmonary effort is normal.     Breath sounds: Normal breath sounds. No decreased breath sounds, wheezing or rhonchi.  Chest:     Chest wall: No tenderness.  Abdominal:     General: Bowel sounds are normal. There is no distension.     Palpations: Abdomen is  soft.     Tenderness: There is no abdominal tenderness.  Musculoskeletal:        General: No tenderness or deformity.     Right lower leg: No edema.     Left lower leg: No edema.  Skin:    General: Skin is warm and dry.  Neurological:     Mental Status: He is alert and oriented to person, place, and time.      ED Treatments / Results  Labs (all labs ordered are listed, but only abnormal results are displayed) Labs Reviewed  BASIC METABOLIC PANEL - Abnormal; Notable for the following components:      Result Value   Potassium 2.9 (*)    Glucose, Bld 131 (*)    All other components within normal limits  CBC - Abnormal; Notable for the following components:   WBC 10.7 (*)    All other components within normal limits  URINALYSIS, ROUTINE W REFLEX MICROSCOPIC - Abnormal; Notable for the following components:   Color, Urine STRAW (*)    Hgb urine dipstick SMALL (*)    All other components within normal limits  RAPID URINE DRUG SCREEN, HOSP PERFORMED - Abnormal; Notable for the following components:   Cocaine POSITIVE (*)    All other components within normal limits  TROPONIN I  BRAIN NATRIURETIC PEPTIDE  TROPONIN I  D-DIMER, QUANTITATIVE (NOT AT [facility])    EKG EKG Interpretation  Date/Time:  Sunday Jul 26 2018 14:00:35 EDT Ventricular Rate:  125 PR Interval:    QRS Duration: 110 QT Interval:  320 QTC Calculation: 462 R Axis:   -53 Text Interpretation:  Sinus tachycardia Incomplete RBBB and LAFB Baseline wander No previous tracing Confirmed by Lajean Saver [phone]) on 07/26/2018 2:11:17 PM   Radiology Dg Chest 2 View  Result Date: 07/26/2018 CLINICAL DATA:  Midsternal chest pain EXAM: CHEST - 2 VIEW COMPARISON:  None. FINDINGS: The heart size and mediastinal contours are within normal limits. Both lungs are clear. The visualized skeletal structures are unremarkable. IMPRESSION: No active cardiopulmonary disease. Electronically Signed   By: Dorise Bullion III M.D   On:  07/26/2018 14:55    Procedures Procedures (including critical care time)  Medications Ordered in ED Medications  amLODipine (NORVASC) tablet 5 mg (has no administration in time range)  sodium chloride flush (NS) 0.9 % injection 3 mL (3 mLs Intravenous Given 07/26/18 1442)  sodium chloride 0.9 % bolus 500 mL (0 mLs Intravenous Stopped 07/26/18 1721)  potassium chloride SA (K-DUR) CR tablet 40 mEq (40 mEq Oral Given 07/26/18 1559)  potassium chloride 10 mEq in 100 mL IVPB (0 mEq Intravenous Stopped 07/26/18 1721)  Initial Impression / Assessment and Plan / ED Course  I have reviewed the triage vital signs and the nursing notes.  Pertinent labs & imaging results that were available during my care of the patient were reviewed by me and considered in my medical decision making (see chart for details).    Patient with a PMH of HTN, HLD presents to the ED with a chief complaint of chest pain x 4 hours ago. He arrived int he ED hypertensive, tachycardic with a HR of ~122, he reports pain along his chest.  Does report he last used cocaine on Friday, denies using any prior to arrival.  High suspicion that he likely used cocaine prior to arrival due to his vital signs. CBC showed leukocytosis, hemoglobin is within normal limits. BMP showed hypokalemia 2.9. Creatine level is within normal limits. BNP is normal. First troponin was negative. UA showed no leukocytes, wbc's denies any urinary symptoms at this time. Xray of his chest showed: No consolidation, pneumothorax,or pleural effusion.   Patient UDS was positive for cocaine, although he reports last using this Friday. He was given Potassium replacement 40 mg oral along with IV replacement, states this pain is much better, reports he felt like this was indigestion. He reports compliance with this BP meds, currently takes 5 mg Norvasc reports taking this today. This was given today during his visit as his pressure was elevated.  Etiology troponin was  obtained on patient, which was negative, low suspicion for ACS.  I do suspect patient likely used cocaine prior to arrival.  A d-dimer was obtained, this was also negative.  Patient was given his Norvasc to help with his pressure.  He has been educated on discontinuing cocaine use.  Patient understands and agrees with management, reports his chest pain has improved significantly.  Patient with stable vital signs, stable for discharge.  Portions of this note were generated with [employer]. Dictation errors may occur despite best attempts at proofreading.    Final Clinical Impressions(s) / ED Diagnoses   Final diagnoses:  Atypical chest pain    ED Discharge Orders    None       Janeece Fitting, Hershal Coria 07/26/18 1955    Lajean Saver, MD 07/27/18 1500

## 2018-07-26 NOTE — Discharge Instructions (Addendum)
Your laboratory results today showed a low potassium, this was replaced during your ED visit. Please follow up with your PCP in 1 week. If you experience any chest pain, shortness of breath please return to the ED.

## 2018-07-26 NOTE — ED Triage Notes (Signed)
Pt reports mid sternal chest pain, non radiating x 4 hours. "Feels like indigestion". Denies NVD. Reports "a little shortness of breath". HR 123. No ectopy. Pt is alert, oriented and ambulatory.

## 2018-07-30 ENCOUNTER — Telehealth: Payer: 59 | Admitting: Family Medicine

## 2018-08-05 ENCOUNTER — Telehealth (INDEPENDENT_AMBULATORY_CARE_PROVIDER_SITE_OTHER): Payer: 59 | Admitting: Family Medicine

## 2018-08-05 DIAGNOSIS — I10 Essential (primary) hypertension: Secondary | ICD-10-CM | POA: Diagnosis not present

## 2018-08-05 DIAGNOSIS — R0602 Shortness of breath: Secondary | ICD-10-CM

## 2018-08-05 DIAGNOSIS — R0789 Other chest pain: Secondary | ICD-10-CM | POA: Diagnosis not present

## 2018-08-05 DIAGNOSIS — R0989 Other specified symptoms and signs involving the circulatory and respiratory systems: Secondary | ICD-10-CM

## 2018-08-05 DIAGNOSIS — F14929 Cocaine use, unspecified with intoxication, unspecified: Secondary | ICD-10-CM

## 2018-08-05 DIAGNOSIS — F17210 Nicotine dependence, cigarettes, uncomplicated: Secondary | ICD-10-CM

## 2018-08-05 DIAGNOSIS — Z8781 Personal history of (healed) traumatic fracture: Secondary | ICD-10-CM | POA: Diagnosis not present

## 2018-08-05 MED ORDER — IPRATROPIUM BROMIDE 0.03 % NA SOLN: 2.0000 | Freq: Two times a day (BID) | NASAL | 1 refills | Status: DC

## 2018-08-05 MED ORDER — SILDENAFIL CITRATE 100 MG PO TABS: 50.0000 mg | ORAL_TABLET | Freq: Every day | ORAL | 2 refills | Status: DC | PRN

## 2018-08-05 NOTE — Progress Notes (Signed)
Virtual Visit via Telephone Note, video technology unavailable   I connected with Daniel Silva on 08/05/18 at  2:30 PM EDT by telephone and verified that I am speaking with the correct person using two identifiers.  Location: Patient: Located at home during today's encounter  Provider: Located at primary care office     I discussed the limitations, risks, security and privacy concerns of performing an evaluation and management service by telephone and the availability of in person appointments. I also discussed with the patient that there may be a patient responsible charge related to this service. The patient expressed understanding and agreed to proceed.   History of Present Illness: Daniel Silva, medical history significant for hypertension, erectile dysfunction, and tobacco use presented to the [facility] ER on 07/26/18 with a complaint of chest pain. Upon arrival, patient complained of 4 hours of gradually worsening chest pain and admitted to recent cocaine use. Blood pressure was elevated on arrival 168/118. He also experienced shortness of breath and tachycardia.  Labs were significant for hypokalemia and UDS positive for cocaine.  His troponins were negative. He was given a GI cocktail while at the ER and symptoms improved.  His potassium was also replaced.  He continued to have some mild chest pain upon arriving back home however couple days ago he took an anti-gas reducing medication and this seemed to completely resolve his symptoms. He remarks that prior to developing symptoms of chest pain he had eaten a large plate of food which she had ruled out the day prior and he wonders if that had any impact on his symptoms. At present he has been avoiding greasy, spicy, fatty foods and has not had any recurrence of chest pain or GI symptoms. He denies any shortness of breath, swelling, dizziness, or headache. He is unable to monitor his blood pressure at home as he does not have a blood pressure cuff  however endorses compliance with blood pressure medication.    Referral to ENT Daniel Silva reports a history of chronic sinus symptoms and rhinitis. He has taken multiple otc antihistamines without improvement. At times, finds it difficult to breath through his nose. Reports a history of a nose fracture several years back and noticed nasal symptoms seen to start and persists since that time. Most worrisome symptoms is congestions and sinus pressure. No fever or associated URI symptoms.  Assessment and Plan: 1. Chronic sinus complaints Trial Atrovent nasal spray and OTC cetrizine  - Ambulatory referral to ENT  2. History of fracture of nose - Ambulatory referral to ENT  3. Atypical chest pain, resolved for now. GERD vs Cocaine use? Advised to follow-up here in office or go immediately to the emergency department if symptoms recur.  4. Essential hypertension Recently uncontrolled and of recent ER visit.  Patient advised to continue with current medication regimen.  Encouraged to check blood pressure intermittently if the monitor is available.  Keep follow-up appointment no changes to current medication regimen.    Follow Up Instructions: Keep current follow-up. If chest pain continues, please go immediately to the ER.   I discussed the assessment and treatment plan with the patient. The patient was provided an opportunity to ask questions and all were answered. The patient agreed with the plan and demonstrated an understanding of the instructions.   The patient was advised to call back or seek an in-person evaluation if the symptoms worsen or if the condition fails to improve as anticipated.  I provided 25 minutes of non-face-to-face time during  this encounter.   Molli Barrows, FNP-C

## 2018-08-05 NOTE — Progress Notes (Deleted)
Worked up patient for their televistit with provider Molli Barrows, FNP-C. Verified DOB. States that chest pain resolved with Gas-X.

## 2018-09-06 ENCOUNTER — Emergency Department (HOSPITAL_COMMUNITY): Payer: 59

## 2018-09-06 ENCOUNTER — Encounter (HOSPITAL_COMMUNITY): Payer: Self-pay

## 2018-09-06 ENCOUNTER — Emergency Department (HOSPITAL_COMMUNITY)
Admission: EM | Admit: 2018-09-06 | Discharge: 2018-09-06 | Disposition: A | Discharge status: Home / Self Care | Payer: 59 | Attending: Emergency Medicine | Admitting: Emergency Medicine

## 2018-09-06 ENCOUNTER — Other Ambulatory Visit: Payer: Self-pay

## 2018-09-06 DIAGNOSIS — Z79899 Other long term (current) drug therapy: Secondary | ICD-10-CM | POA: Diagnosis not present

## 2018-09-06 DIAGNOSIS — F17210 Nicotine dependence, cigarettes, uncomplicated: Secondary | ICD-10-CM | POA: Insufficient documentation

## 2018-09-06 DIAGNOSIS — K859 Acute pancreatitis without necrosis or infection, unspecified: Secondary | ICD-10-CM | POA: Insufficient documentation

## 2018-09-06 DIAGNOSIS — I10 Essential (primary) hypertension: Secondary | ICD-10-CM | POA: Diagnosis not present

## 2018-09-06 DIAGNOSIS — R1013 Epigastric pain: Secondary | ICD-10-CM

## 2018-09-06 HISTORY — DX: Essential (primary) hypertension: I10

## 2018-09-06 LAB — CBC WITH DIFFERENTIAL/PLATELET
Abs Immature Granulocytes: 0.05 10*3/uL (ref 0.00–0.07)
Basophils Absolute: 0.1 10*3/uL (ref 0.0–0.1)
Basophils Relative: 0 %
Eosinophils Absolute: 0.1 10*3/uL (ref 0.0–0.5)
Eosinophils Relative: 1 %
HCT: 45.6 % (ref 39.0–52.0)
Hemoglobin: 14.2 g/dL (ref 13.0–17.0)
Immature Granulocytes: 0 %
Lymphocytes Relative: 30 %
Lymphs Abs: 3.8 10*3/uL (ref 0.7–4.0)
MCH: 29.6 pg (ref 26.0–34.0)
MCHC: 31.1 g/dL (ref 30.0–36.0)
MCV: 95 fL (ref 80.0–100.0)
Monocytes Absolute: 1.1 10*3/uL — ABNORMAL HIGH (ref 0.1–1.0)
Monocytes Relative: 9 %
Neutro Abs: 7.8 10*3/uL — ABNORMAL HIGH (ref 1.7–7.7)
Neutrophils Relative %: 60 %
Platelets: 306 10*3/uL (ref 150–400)
RBC: 4.8 MIL/uL (ref 4.22–5.81)
RDW: 14.2 % (ref 11.5–15.5)
WBC: 13 10*3/uL — ABNORMAL HIGH (ref 4.0–10.5)
nRBC: 0 % (ref 0.0–0.2)

## 2018-09-06 LAB — COMPREHENSIVE METABOLIC PANEL
ALT: 64 U/L — ABNORMAL HIGH (ref 0–44)
AST: 48 U/L — ABNORMAL HIGH (ref 15–41)
Albumin: 4.3 g/dL (ref 3.5–5.0)
Alkaline Phosphatase: 100 U/L (ref 38–126)
Anion gap: 11 (ref 5–15)
BUN: 10 mg/dL (ref 6–20)
CO2: 24 mmol/L (ref 22–32)
Calcium: 9.2 mg/dL (ref 8.9–10.3)
Chloride: 105 mmol/L (ref 98–111)
Creatinine, Ser: 0.82 mg/dL (ref 0.61–1.24)
GFR calc Af Amer: >60 mL/min (ref >60)
GFR calc non Af Amer: >60 mL/min (ref >60)
Glucose, Bld: 114 mg/dL — ABNORMAL HIGH (ref 70–99)
Potassium: 3.2 mmol/L — ABNORMAL LOW (ref 3.5–5.1)
Sodium: 140 mmol/L (ref 135–145)
Total Bilirubin: 0.6 mg/dL (ref 0.3–1.2)
Total Protein: 7.6 g/dL (ref 6.5–8.1)

## 2018-09-06 LAB — URINALYSIS, ROUTINE W REFLEX MICROSCOPIC
Bacteria, UA: NONE SEEN
Bilirubin Urine: NEGATIVE
Glucose, UA: NEGATIVE mg/dL
Ketones, ur: NEGATIVE mg/dL
Leukocytes,Ua: NEGATIVE
Nitrite: NEGATIVE
Protein, ur: NEGATIVE mg/dL
Specific Gravity, Urine: 1.015 (ref 1.005–1.030)
pH: 6 (ref 5.0–8.0)

## 2018-09-06 LAB — LIPASE, BLOOD: Lipase: 95 U/L — ABNORMAL HIGH (ref 11–51)

## 2018-09-06 MED ORDER — OXYCODONE-ACETAMINOPHEN 5-325 MG PO TABS: 1.0000 | ORAL_TABLET | ORAL | 0 refills | Status: DC | PRN

## 2018-09-06 MED ORDER — IOHEXOL 300 MG/ML  SOLN
100.0000 mL | Freq: Once | INTRAMUSCULAR | Status: AC | PRN
  Administered 2018-09-06: 100 mL via INTRAVENOUS

## 2018-09-06 MED ORDER — DICYCLOMINE HCL 10 MG PO CAPS
10.0000 mg | ORAL_CAPSULE | Freq: Once | ORAL | Status: AC
  Administered 2018-09-06: 10 mg via ORAL
  Filled 2018-09-06: qty 1

## 2018-09-06 MED ORDER — ONDANSETRON HCL 4 MG PO TABS: 4.0000 mg | ORAL_TABLET | Freq: Three times a day (TID) | ORAL | 0 refills | Status: DC | PRN

## 2018-09-06 MED ORDER — HYDROMORPHONE HCL 1 MG/ML IJ SOLN
1.0000 mg | Freq: Once | INTRAMUSCULAR | Status: AC
  Administered 2018-09-06: 1 mg via INTRAVENOUS
  Filled 2018-09-06: qty 1

## 2018-09-06 MED ORDER — FAMOTIDINE IN NACL 20-0.9 MG/50ML-% IV SOLN
20.0000 mg | Freq: Once | INTRAVENOUS | Status: AC
  Administered 2018-09-06: 20 mg via INTRAVENOUS
  Filled 2018-09-06: qty 50

## 2018-09-06 MED ORDER — SODIUM CHLORIDE 0.9 % IV BOLUS
1000.0000 mL | Freq: Once | INTRAVENOUS | Status: AC
  Administered 2018-09-06: 1000 mL via INTRAVENOUS

## 2018-09-06 MED ORDER — SODIUM CHLORIDE (PF) 0.9 % IJ SOLN
INTRAMUSCULAR | Status: AC
  Filled 2018-09-06: qty 50
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
## 2018-09-06 NOTE — ED Notes (Signed)
ED Provider at bedside. 

## 2018-09-06 NOTE — ED Triage Notes (Signed)
He c/o llq area abd. Discomfort since this morning. He also tells me he has had an average of two liquid stools per day x 3 months. He states the liquid stool occurred after being prescribed "a new blood pressure medicine". He is in no distress. He denies fever/n/v/dysuria.

## 2018-09-06 NOTE — ED Notes (Signed)
Patient transported to CT 

## 2018-09-07 ENCOUNTER — Encounter: Payer: Self-pay | Admitting: Family Medicine

## 2018-09-07 ENCOUNTER — Other Ambulatory Visit: Payer: Self-pay | Admitting: Family Medicine

## 2018-09-07 DIAGNOSIS — K858 Other acute pancreatitis without necrosis or infection: Secondary | ICD-10-CM

## 2018-09-07 DIAGNOSIS — Z1211 Encounter for screening for malignant neoplasm of colon: Secondary | ICD-10-CM

## 2018-09-07 NOTE — Progress Notes (Signed)
Referral placed to gastroenterology for evaluation of acute pancreatitis overdue for colonoscopy

## 2018-09-09 ENCOUNTER — Encounter: Payer: Self-pay | Admitting: Gastroenterology

## 2018-09-16 ENCOUNTER — Telehealth: Payer: Self-pay

## 2018-09-16 NOTE — Telephone Encounter (Signed)
Called patient to do their pre-visit COVID screening.  Have you been tested for COVID or are you currently waiting for COVID test results? no  Have you recently traveled internationally([place], [place], [place], [place], [place]) or within the [place] to a hotspot area([place], [place], [place], [place], [place])? no  Are you currently experiencing any of the following: fever, cough, SHOB, fatigue, body aches, loss of smell, rash, diarrhea, vomiting, severe headaches, weakness, sore throat? no  Have you been in contact with anyone who has recently travelled? no  Have you been in contact with anyone who is experiencing any of the above symptoms or been diagnosed with [place]  or works in or has recently visited a SNF? No  Asked patient to come in fasting so that lipid panel can be repeated.

## 2018-09-17 ENCOUNTER — Ambulatory Visit (INDEPENDENT_AMBULATORY_CARE_PROVIDER_SITE_OTHER): Payer: 59 | Admitting: Family Medicine

## 2018-09-17 ENCOUNTER — Other Ambulatory Visit: Payer: Self-pay

## 2018-09-17 ENCOUNTER — Encounter: Payer: Self-pay | Admitting: Family Medicine

## 2018-09-17 VITALS — BP 123/77 | HR 92 | Temp 97.5°F | Resp 17 | Ht 74.0 in | Wt 224.0 lb

## 2018-09-17 DIAGNOSIS — N529 Male erectile dysfunction, unspecified: Secondary | ICD-10-CM | POA: Diagnosis not present

## 2018-09-17 DIAGNOSIS — R972 Elevated prostate specific antigen [PSA]: Secondary | ICD-10-CM

## 2018-09-17 DIAGNOSIS — R7989 Other specified abnormal findings of blood chemistry: Secondary | ICD-10-CM | POA: Diagnosis not present

## 2018-09-17 DIAGNOSIS — I10 Essential (primary) hypertension: Secondary | ICD-10-CM | POA: Diagnosis not present

## 2018-09-17 DIAGNOSIS — E782 Mixed hyperlipidemia: Secondary | ICD-10-CM

## 2018-09-17 MED ORDER — SILDENAFIL CITRATE 100 MG PO TABS: 50.0000 mg | ORAL_TABLET | Freq: Every day | ORAL | 6 refills | Status: DC | PRN

## 2018-09-17 NOTE — Progress Notes (Signed)
Patient ID: Fionn Stracke, male    DOB: 10/22/1965, 53 y.o.   MRN: [id number]  PCP: Scot Jun, FNP  Chief Complaint  Patient presents with  . Hypertension  . Erectile Dysfunction    Subjective:  HPI  Saurabh Hettich is a 53 y.o. male presents for hypertension and ED follow-up.  Earnest Rosier reports no home monitoring of blood pressure. Reports adherence to blood pressure medications. Reports efforts to adhere to low sodium diet. He is a nonsmoker, but drinks alcohol routinely. He is active although does not engage in routine physical activity. Patient's blood pressure has been historically elevated at prior visits however today it is within normal expected range. He was seen in ED approximately a month and a half ago with atypical chest pain and was found to have a positive urine drug screen indicating recent cocaine use.  He has not had any additional chest pain. He represented to the ER last month with complaint of abdominal pain and was found to have acute pancreatitis. He endorses 2 days straight of very heavy alcohol consumption and attributes that activity to pancreatitis episode.  He has not had any additional abdominal pain or fever.  He is scheduled to follow-up with GI for colonoscopy the end of July. Quadre also is currently taking Viagra for ED. reports medication has been effective in improving ED symptoms.  During a routine work-up he was found to have a low testosterone level.  He is currently applying AndroGel as prescribed.  He denies any rashes or skin irritations occurring to the affected area.  He denies any episodes of dizziness, headaches, shortness of breath, or chest pain. Social History   Socioeconomic History  . Marital status: Married    Spouse name: Not on file  . Number of children: Not on file  . Years of education: Not on file  . Highest education level: Not on file  Occupational History  . Not on file  Social Needs  . Financial resource strain:  Not on file  . Food insecurity    Worry: Not on file    Inability: Not on file  . Transportation needs    Medical: Not on file    Non-medical: Not on file  Tobacco Use  . Smoking status: Current Every Day Smoker    Types: Cigarettes  . Smokeless tobacco: Never Used  . Tobacco comment: 1 cig/day  Substance and Sexual Activity  . Alcohol use: Yes  . Drug use: Not Currently  . Sexual activity: Yes    Partners: Female  Lifestyle  . Physical activity    Days per week: Not on file    Minutes per session: Not on file  . Stress: Not on file  Relationships  . Social Herbalist on phone: Not on file    Gets together: Not on file    Attends religious service: Not on file    Active member of club or organization: Not on file    Attends meetings of clubs or organizations: Not on file    Relationship status: Not on file  . Intimate partner violence    Fear of current or ex partner: Not on file    Emotionally abused: Not on file    Physically abused: Not on file    Forced sexual activity: Not on file  Other Topics Concern  . Not on file  Social History Narrative  . Not on file    Family History  Problem Relation Age of  Onset  . Hypertension Mother   . Diabetes Neg Hx   . Hyperlipidemia Neg Hx   . Heart disease Neg Hx    Review of Systems Pertinent negatives listed in HPI Patient Active Problem List   Diagnosis Date Noted  . Erectile dysfunction 05/31/2018  . Essential hypertension 05/31/2018  . Low testosterone in male 05/31/2018    No Known Allergies  Prior to Admission medications   Medication Sig Start Date End Date Taking? Authorizing Provider  amLODipine (NORVASC) 5 MG tablet Take 2 tablets (10 mg total) by mouth daily. 05/28/18  Yes Scot Jun, FNP  atorvastatin (LIPITOR) 10 MG tablet Take 1 tablet (10 mg total) by mouth daily. 05/04/18  Yes Scot Jun, FNP  sildenafil (VIAGRA) 100 MG tablet Take 0.5 tablets (50 mg total) by mouth daily as  needed for erectile dysfunction. 08/05/18  Yes Scot Jun, FNP  testosterone (ANDROGEL) 50 MG/5GM (1%) GEL Place 5 g onto the skin daily for 30 days. 06/03/18  Yes Scot Jun, FNP    Past Medical, Surgical Family and Social History reviewed and updated.    Objective:   Today's Vitals   09/17/18 0834  BP: 123/77  Pulse: 92  Resp: 17  Temp: (!) 97.5 F (36.4 C)  TempSrc: Temporal  SpO2: 96%  Weight: 224 lb (101.6 kg)  Height: 6\' 2"  (1.88 m)    Wt Readings from Last 3 Encounters:  09/17/18 224 lb (101.6 kg)  07/26/18 229 lb 15 oz (104.3 kg)  05/28/18 233 lb 9.6 oz (106 kg)     Physical Exam General appearance: alert, well developed, well nourished, cooperative and in no distress Head: Normocephalic, without obvious abnormality, atraumatic Respiratory: Respirations even and unlabored, normal respiratory rate Heart: rate and rhythm normal. No gallop or murmurs noted on exam  Abdomen: BS +, no distention, no rebound tenderness, or no mass Extremities: No gross deformities Skin: Skin color, texture, turgor normal. No rashes seen  Psych: Appropriate mood and affect. Neurologic: Mental status: Alert, oriented to person, place, and time, thought content appropriate.  No results found for: POCGLU  Lab Results  Component Value Date   HGBA1C 5.4 04/22/2018      Assessment & Plan:  1. Essential hypertension, well controlled today We have discussed target BP range and blood pressure goal. I have advised patient to check BP regularly and to call us back or report to clinic if the numbers are consistently higher than 140/90. We discussed the importance of compliance with medical therapy and DASH diet recommended, consequences of uncontrolled hypertension discussed.  - continue current BP medications - Comprehensive metabolic panel  2. Low testosterone in male - PSA, total and free - CBC with Differential  3. Erectile dysfunction, unspecified erectile dysfunction  type -Continue Viagra -We will also continue to monitor testosterone levels to ensure low levels are not contributing to the symptoms.  4. Mixed hyperlipidemia -Endorses compliance with statin therapy.  Will repeat patient had highly elevated triglycerides during previous lipid panel work-up.  Will evaluate today will consider also may be adding gemfibrozil.  Triglycerides have not improved with current statin therapy alone. - Lipid panel  Return in 6 months for routine chronic condition follow-up.  -The patient was given clear instructions to go to ER or return to medical center if symptoms do not improve, worsen or new problems develop. The patient verbalized understanding.    Molli Barrows, FNP Primary Care at [facility] [address] [place]  [phone]: [phone]

## 2018-09-17 NOTE — Patient Instructions (Signed)
Testosterone Replacement Therapy  Testosterone replacement therapy (TRT) is used to treat men who have a low testosterone level (hypogonadism). Testosterone is a male hormone that is produced in the testicles. It is responsible for typically male characteristics and for maintaining a man's sex drive and the ability to get an erection. Testosterone also supports bone and muscle health. TRT can be a gel, liquid, or patch that you put on your skin. It can also be in the form of a tablet or an injection. In some cases, your health care provider may insert long-acting pellets under your skin. In most men, the level of testosterone starts to decline gradually after age 66. Low testosterone can also be caused by certain medical conditions, medicines, and obesity. Your health care provider can diagnose hypogonadism with at least two blood tests that are done early in the morning. Low testosterone may not need to be treated. TRT is usually a choice that you make with your health care provider. Your health care provider may recommend TRT if you have low testosterone that is causing symptoms, such as:  Low sex drive.  Erection problems.  Breast enlargement.  Loss of body hair.  Weak muscles or bones.  Shrinking testicles.  Increased body fat.  Low energy.  Hot flashes.  Depression.  Decreased work Systems analyst. TRT is a lifetime treatment. If you stop treatment, your testosterone will drop, and your symptoms may return. What are the risks? Testosterone replacement therapy may have side effects, including:  Lower sperm count.  Skin irritation at the application or injection site.  Mouth irritation if you take an oral tablet.  Acne.  Swelling of your legs or feet.  Tender breasts.  Dizziness.  Sleep disturbance.  Mood swings.  Possible increased risk of stroke or heart attack. Testosterone replacement therapy may also increase your risk for prostate cancer or male breast cancer.  You should not use TRT if you have either of those conditions. Your health care provider also may not recommend TRT if:  You are suspected of having prostate cancer.  You want to father a child.  You have a high number of red blood cells.  You have untreated sleep apnea.  You have a very large prostate. Supplies needed:  Your health care provider will prescribe the testosterone gel, solution, or medicine that you need. If your health care provider teaches you to do self-injections at home, you will also need: ? Your medicine vial. ? Disposable needles and syringes. ? Alcohol swabs. ? A needle disposal container. ? Adhesive bandages. How to use testosterone replacement therapy Your health care provider will help you find the TRT option that will work best for you based on your preference, the side effects, and the cost. You may:  Rub testosterone gel on your upper arm or shoulder every day after a shower. This is the most common type of TRT. Do not let women or children come in contact with the gel.  Apply a testosterone solution under your arms once each day.  Place a testosterone patch on your skin once each day.  Dissolve a testosterone tablet in your mouth twice each day.  Have a testosterone pellet inserted under your skin by your health care provider. This will be replaced every 3-6 months.  Use testosterone nasal spray three times each day.  Get testosterone injections. For some types of testosterone, your health care provider will give you this injection. With other types of testosterone, you may be taught to give injections to  yourself. The frequency of injections may vary based on the type of testosterone that you receive. Follow these instructions at home:  Take over-the-counter and prescription medicines only as told by your health care provider.  Lose weight if you are overweight. Ask your health care provider to help you start a healthy diet and exercise program to  reach and maintain a healthy weight.  Work with your health care provider to treat other medical conditions that may lower your testosterone. These include obesity, high blood pressure, high cholesterol, diabetes, liver disease, kidney disease, and sleep apnea.  Keep all follow-up visits as told by your health care provider. This is important. General recommendations  Discuss all risks and benefits with your health care provider before starting therapy.  Work with your health care provider to check your prostate health and do blood testing before you start therapy.  Do not use any testosterone replacement therapies that are not prescribed by your health care provider or not approved for use in the [place]  Do not use TRT for bodybuilding or to improve sexual performance. TRT should be used only to treat symptoms of low testosterone.  Return for all repeat prostate checks and blood tests during therapy, as told by your health care provider. Where to find more information Learn more about testosterone replacement therapy from:  [place]: www.urologyhealth.org/urologic-conditions/low-testosterone-(hypogonadism)  Endocrine Society: www.hormone.org/diseases-and-conditions/mens-health/hypogonadism Contact a health care provider if:  You have side effects from your testosterone replacement therapy.  You continue to have symptoms of low testosterone during treatment.  You develop new symptoms during treatment. Summary  Testosterone replacement therapy is only for men who have low testosterone as determined by blood testing and who have symptoms of low testosterone.  Testosterone replacement therapy should be prescribed only by a health care provider and should be used under the supervision of a health care provider.  You may not be able to take testosterone if you have certain medical conditions, including prostate cancer, male breast cancer, or heart disease.   Testosterone replacement therapy may have side effects and may make some medical conditions worse.  Talk with your health care provider about all the risks and benefits before you start therapy. This information is not intended to replace advice given to you by your health care provider. Make sure you discuss any questions you have with your health care provider. Document Released: 11/16/2015 Document Revised: 03/10/2016 Document Reviewed: 11/16/2015 Elsevier Patient Education  2020 [place].   Heart Disease Prevention Heart disease is the leading cause of death in the world. Coronary artery disease is the most common cause of heart disease. This condition results when cholesterol and other substances (plaque) build up inside the walls of the blood vessels that supply your heart muscle (arteries). This buildup in arteries is called atherosclerosis. You can take actions to lower your risk of heart disease. How can heart disease affect me? Heart disease can cause many unpleasant symptoms and complications, such as:  Chest pain (angina).  Reduced or blocked blood flow to your heart. This can cause: ? Irregular heartbeats (arrhythmias). ? Heart attack. ? Heart failure. What can increase my risk? The following factors may make you more likely to develop this condition:  High blood pressure (hypertension).  High cholesterol.  Smoking.  A diet high in saturated fats or trans fats.  Lack of physical activity.  Obesity.  Drinking too much alcohol.  Diabetes.  Having a family history of heart disease. What actions can I take to  prevent heart disease? Nutrition   Eat a heart-healthy eating plan as told by your health care provider. Examples include the DASH (Dietary Approaches to Stop Hypertension) eating plan or the Mediterranean diet.  Generally, it is recommended that you: ? Eat less salt (sodium). Ask your health care provider how much sodium is safe for you. Most people  should have less than 2,300 mg each day. ? Limit unhealthy fats, such as saturated and trans fats, in your diet. You can do this by eating low-fat dairy products, eating less red meat, and avoiding processed foods. ? Eat healthy fats (omega-3 fatty acids). These are found in fish, such as mackerel or salmon. ? Eat more fruits and vegetables. You should try to fill one-half of your plate with fruits and vegetables at each meal. ? Eat more whole grains. ? Avoid foods and drinks that have added sugars. Lifestyle   Get regular exercise. This is one of the most important things you can do for your health. Generally, it is recommended that you: ? Exercise for at least 30 minutes on most days of the week (150 minutes each week). The exercise should increase your heart rate and make you sweat (aerobic exercise). ? Add strength exercises on at least 2 days each week.  Do not use any products that contain nicotine or tobacco, such as cigarettes and e-cigarettes. These can damage your heart and blood vessels. If you need help quitting, ask your health care provider. Alcohol use  Do not drink alcohol if: ? Your health care provider tells you not to drink. ? You are pregnant, may be pregnant, or are planning to become pregnant.  If you drink alcohol, limit how much you have: ? 0-1 drink a day for women. ? 0-2 drinks a day for men.  Be aware of how much alcohol is in your drink. In the [place] one drink equals one typical bottle of beer (12 oz), one-half glass of wine (5 oz), or one shot of hard liquor (1 oz). Medicines  Take over-the-counter and prescription medicines only as told by your health care provider.  Ask your health care provider whether you should take an aspirin every day. Taking aspirin may help reduce your risk of heart disease and stroke.  Depending on your risk factors, your health care provider may prescribe medicines to lower your risk of heart disease or to control related  conditions. You may take medicine to: ? Lower cholesterol. ? Control blood pressure. ? Control diabetes. General information  Keep your blood pressure under control, as recommended by your health care provider. For most healthy people, the upper number of your blood pressure (systolic) should be no higher than 120, and the lower number (diastolic) no higher than 80. Treatment may be needed if your blood pressure is higher than 130/80.  Have your blood pressure checked at least every two years. Your health care provider may check your blood pressure more often if you have high blood pressure.  After age 31, have your cholesterol checked every 4-6 years. If you have risk factors for heart disease, you may need to have it checked more frequently. Treatment may be needed if your cholesterol is high.  Have your body mass index (BMI) checked every year. Your health care provider can calculate your BMI from your height and weight.  Work with your health care provider to lose weight, if needed, or to maintain a healthy weight. Where to find more information:  Centers for Disease Control and Prevention:  TextNotebook.fi  American Heart Association: www.heart.org ? Take a free online heart disease risk quiz to better understand your personal risk factors. Summary  Heart disease is the leading cause of death in the world.  Heart disease can cause chest pain, abnormal heart rhythms, heart attack, and heart failure.  High blood pressure, high cholesterol, and smoking are the main risk factors for heart disease, although other factors also contribute.  You can take actions to lower your chances of developing heart disease. Work with your health care provider to reduce your risk by following a heart-healthy diet, being physically active, and controlling your weight, blood pressure, and cholesterol level. This information is not intended to replace advice given to you by your health care  provider. Make sure you discuss any questions you have with your health care provider. Document Released: 10/10/2003 Document Revised: 03/12/2017 Document Reviewed: 03/12/2017 Elsevier Patient Education  2020 [employer].

## 2018-09-18 LAB — COMPREHENSIVE METABOLIC PANEL
ALT: 28 IU/L (ref 0–44)
AST: 21 IU/L (ref 0–40)
Albumin/Globulin Ratio: 1.4 (ref 1.2–2.2)
Albumin: 4.2 g/dL (ref 3.8–4.9)
Alkaline Phosphatase: 111 IU/L (ref 39–117)
BUN/Creatinine Ratio: 12 (ref 9–20)
BUN: 13 mg/dL (ref 6–24)
Bilirubin Total: 0.2 mg/dL (ref 0.0–1.2)
CO2: 19 mmol/L — ABNORMAL LOW (ref 20–29)
Calcium: 9.4 mg/dL (ref 8.7–10.2)
Chloride: 107 mmol/L — ABNORMAL HIGH (ref 96–106)
Creatinine, Ser: 1.1 mg/dL (ref 0.76–1.27)
GFR calc Af Amer: 88 mL/min/{1.73_m2} (ref >59)
GFR calc non Af Amer: 76 mL/min/{1.73_m2} (ref >59)
Globulin, Total: 2.9 g/dL (ref 1.5–4.5)
Glucose: 100 mg/dL — ABNORMAL HIGH (ref 65–99)
Potassium: 4.1 mmol/L (ref 3.5–5.2)
Sodium: 145 mmol/L — ABNORMAL HIGH (ref 134–144)
Total Protein: 7.1 g/dL (ref 6.0–8.5)

## 2018-09-18 LAB — LIPID PANEL
Chol/HDL Ratio: 5.4 ratio — ABNORMAL HIGH (ref 0.0–5.0)
Cholesterol, Total: 163 mg/dL (ref 100–199)
HDL: 30 mg/dL — ABNORMAL LOW (ref >39)
LDL Calculated: 103 mg/dL — ABNORMAL HIGH (ref 0–99)
Triglycerides: 151 mg/dL — ABNORMAL HIGH (ref 0–149)
VLDL Cholesterol Cal: 30 mg/dL (ref 5–40)

## 2018-09-18 LAB — CBC WITH DIFFERENTIAL/PLATELET
Basophils Absolute: 0.1 10*3/uL (ref 0.0–0.2)
Basos: 1 %
EOS (ABSOLUTE): 0.4 10*3/uL (ref 0.0–0.4)
Eos: 5 %
Hematocrit: 38.5 % (ref 37.5–51.0)
Hemoglobin: 12.5 g/dL — ABNORMAL LOW (ref 13.0–17.7)
Immature Grans (Abs): 0 10*3/uL (ref 0.0–0.1)
Immature Granulocytes: 0 %
Lymphocytes Absolute: 3.1 10*3/uL (ref 0.7–3.1)
Lymphs: 41 %
MCH: 29.1 pg (ref 26.6–33.0)
MCHC: 32.5 g/dL (ref 31.5–35.7)
MCV: 90 fL (ref 79–97)
Monocytes Absolute: 0.5 10*3/uL (ref 0.1–0.9)
Monocytes: 7 %
Neutrophils Absolute: 3.6 10*3/uL (ref 1.4–7.0)
Neutrophils: 46 %
Platelets: 423 10*3/uL (ref 150–450)
RBC: 4.29 x10E6/uL (ref 4.14–5.80)
RDW: 12.6 % (ref 11.6–15.4)
WBC: 7.7 10*3/uL (ref 3.4–10.8)

## 2018-09-18 LAB — PSA, TOTAL AND FREE
PSA, Free Pct: 5.6 %
PSA, Free: 0.44 ng/mL
Prostate Specific Ag, Serum: 7.9 ng/mL — ABNORMAL HIGH (ref 0.0–4.0)

## 2018-09-18 LAB — TESTOSTERONE FREE, PROFILE I
Sex Hormone Binding: 18.6 nmol/L — ABNORMAL LOW (ref 19.3–76.4)
Testost., Free, Calc: 79.8 pg/mL (ref 35.8–168.2)
Testosterone: 299 ng/dL (ref 264–916)

## 2018-09-21 ENCOUNTER — Other Ambulatory Visit: Payer: Self-pay | Admitting: Family Medicine

## 2018-09-21 ENCOUNTER — Encounter: Payer: Self-pay | Admitting: Family Medicine

## 2018-09-21 MED ORDER — ATORVASTATIN CALCIUM 40 MG PO TABS: 40.0000 mg | ORAL_TABLET | Freq: Every day | ORAL | 3 refills | Status: DC

## 2018-09-21 NOTE — Telephone Encounter (Signed)
Patient notified of lab results & recommendations. Expressed understanding. Is agreeable to increased Atorvastatin dose. Prescription sent to [facility] on file.

## 2018-09-21 NOTE — Telephone Encounter (Signed)
Patient's PSA is elevated compared to previous PSA level 5 months ago. This is likely a result of the testostrone replacement therapy and he will need to discontinue use immediately.  Testosterone deficiency and elevated PSA needs to be closely monitored therefore I am referring him to urology. Cholesterol is improving, although remains elevated increasing his risk of CVA or Heart attack. Increasing atorvastatin 40 mg daily.

## 2018-09-21 NOTE — Telephone Encounter (Signed)
PLEASE call patient with labs

## 2018-09-21 NOTE — Telephone Encounter (Signed)
Left voice mail to call back 

## 2018-09-21 NOTE — Addendum Note (Signed)
Addended by: Scot Jun on: 09/21/2018 04:59 PM   Modules accepted: Orders

## 2018-10-07 ENCOUNTER — Telehealth: Payer: Self-pay

## 2018-10-07 NOTE — Telephone Encounter (Signed)
Covid-19 screening questions   Do you now or have you had a fever in the last 14 days? No  Do you have any respiratory symptoms of shortness of breath or cough now or in the last 14 days? No  Do you have any family members or close contacts with diagnosed or suspected Covid-19 in the past 14 days? no  Have you been tested for Covid-19 and found to be positive? no        

## 2018-10-08 ENCOUNTER — Ambulatory Visit (INDEPENDENT_AMBULATORY_CARE_PROVIDER_SITE_OTHER): Payer: 59 | Admitting: Gastroenterology

## 2018-10-08 ENCOUNTER — Encounter: Payer: Self-pay | Admitting: Gastroenterology

## 2018-10-08 VITALS — BP 132/86 | HR 108 | Temp 98.8°F | Ht 74.0 in | Wt 221.2 lb

## 2018-10-08 DIAGNOSIS — Z1211 Encounter for screening for malignant neoplasm of colon: Secondary | ICD-10-CM

## 2018-10-08 DIAGNOSIS — R945 Abnormal results of liver function studies: Secondary | ICD-10-CM | POA: Diagnosis not present

## 2018-10-08 DIAGNOSIS — K8500 Idiopathic acute pancreatitis without necrosis or infection: Secondary | ICD-10-CM | POA: Diagnosis not present

## 2018-10-08 DIAGNOSIS — Z1212 Encounter for screening for malignant neoplasm of rectum: Secondary | ICD-10-CM

## 2018-10-08 DIAGNOSIS — R197 Diarrhea, unspecified: Secondary | ICD-10-CM

## 2018-10-08 DIAGNOSIS — R7989 Other specified abnormal findings of blood chemistry: Secondary | ICD-10-CM

## 2018-10-08 MED ORDER — NA SULFATE-K SULFATE-MG SULF 17.5-3.13-1.6 GM/177ML PO SOLN: 1.0000 | Freq: Once | ORAL | 0 refills | Status: AC
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
## 2018-10-08 NOTE — Patient Instructions (Signed)
You have been scheduled for a colonoscopy. Please follow written instructions given to you at your visit today.  Please pick up your prep supplies at the pharmacy within the next 1-3 days. If you use inhalers (even only as needed), please bring them with you on the day of your procedure.  Thank you for choosing me and [facility].  Malcolm T. Stark, Jr., MD., FACG  

## 2018-10-12 ENCOUNTER — Encounter: Payer: 59 | Admitting: Gastroenterology

## 2018-10-19 ENCOUNTER — Encounter: Payer: Self-pay | Admitting: Family Medicine

## 2018-10-20 ENCOUNTER — Other Ambulatory Visit: Payer: Self-pay | Admitting: Otolaryngology

## 2018-10-26 ENCOUNTER — Telehealth: Payer: Self-pay | Admitting: Gastroenterology

## 2018-10-26 NOTE — Telephone Encounter (Signed)

## 2018-10-27 ENCOUNTER — Other Ambulatory Visit: Payer: Self-pay

## 2018-10-27 ENCOUNTER — Ambulatory Visit (AMBULATORY_SURGERY_CENTER): Payer: 59 | Admitting: Gastroenterology

## 2018-10-27 ENCOUNTER — Encounter: Payer: Self-pay | Admitting: Gastroenterology

## 2018-10-27 VITALS — BP 133/83 | HR 80 | Temp 98.3°F | Resp 18 | Ht 74.0 in | Wt 221.0 lb

## 2018-10-27 DIAGNOSIS — Z1211 Encounter for screening for malignant neoplasm of colon: Secondary | ICD-10-CM | POA: Diagnosis not present

## 2018-10-27 DIAGNOSIS — D120 Benign neoplasm of cecum: Secondary | ICD-10-CM

## 2018-10-27 MED ORDER — SODIUM CHLORIDE 0.9 % IV SOLN: 500.0000 mL | Freq: Once | INTRAVENOUS | Status: DC

## 2018-10-27 NOTE — Progress Notes (Signed)
Called to room to assist during endoscopic procedure.  Patient ID and intended procedure confirmed with present staff. Received instructions for my participation in the procedure from the performing physician.  

## 2018-10-27 NOTE — Op Note (Signed)
[facility] Patient Name: Daniel Silva Procedure Date: 10/27/2018 2:07 PM MRN: [id number] Endoscopist: Ladene Artist , MD Age: 53 Referring MD:  Date of Birth: 1965/03/20 Gender: Male Account #: [id number] Procedure:                Colonoscopy Indications:              Screening for colorectal malignant neoplasm Medicines:                Monitored Anesthesia Care Procedure:                Pre-Anesthesia Assessment:                           - Prior to the procedure, a History and Physical                            was performed, and patient medications and                            allergies were reviewed. The patient's tolerance of                            previous anesthesia was also reviewed. The risks                            and benefits of the procedure and the sedation                            options and risks were discussed with the patient.                            All questions were answered, and informed consent                            was obtained. Prior Anticoagulants: The patient has                            taken no previous anticoagulant or antiplatelet                            agents. ASA Grade Assessment: II - A patient with                            mild systemic disease. After reviewing the risks                            and benefits, the patient was deemed in                            satisfactory condition to undergo the procedure.                           After obtaining informed consent, the colonoscope  was passed under direct vision. Throughout the                            procedure, the patient's blood pressure, pulse, and                            oxygen saturations were monitored continuously. The                            Colonoscope was introduced through the anus and                            advanced to the the cecum, identified by                            appendiceal orifice and  ileocecal valve. The                            ileocecal valve, appendiceal orifice, and rectum                            were photographed. The quality of the bowel                            preparation was good. The patient tolerated the                            procedure well. The colonoscopy was somewhat                            difficult due to previous surgery, restricted                            mobility of the colon and a tortuous colon. Scope In: 2:15:02 PM Scope Out: 2:35:33 PM Scope Withdrawal Time: 0 hours 12 minutes 54 seconds  Total Procedure Duration: 0 hours 20 minutes 31 seconds  Findings:                 The perianal and digital rectal examinations were                            normal.                           A 10 mm polyp was found in the cecum. The polyp was                            sessile. The polyp was removed with a hot snare.                            Resection and retrieval were complete.                           There was evidence of a prior end-to-side  colo-colonic anastomosis at 25 cm in the sigmoid                            colon. This was patent and was characterized by                            healthy appearing mucosa. The anastomosis was                            traversed.                           Internal hemorrhoids were found during                            retroflexion. The hemorrhoids were moderate and                            Grade I (internal hemorrhoids that do not prolapse).                           The exam was otherwise without abnormality on                            direct and retroflexion views. Complications:            No immediate complications. Estimated blood loss:                            None. Estimated Blood Loss:     Estimated blood loss: none. Impression:               - One 10 mm polyp in the cecum, removed with a hot                            snare. Resected and  retrieved.                           - Patent end-to-side colo-colonic anastomosis,                            characterized by healthy appearing mucosa.                           - Internal hemorrhoids.                           - The examination was otherwise normal on direct                            and retroflexion views. Recommendation:           - Repeat colonoscopy with timing set for                            surveillance based on pathology results.                           -  Patient has a contact number available for                            emergencies. The signs and symptoms of potential                            delayed complications were discussed with the                            patient. Return to normal activities tomorrow.                            Written discharge instructions were provided to the                            patient.                           - Resume previous diet.                           - Continue present medications.                           - Await pathology results.                           - No aspirin, ibuprofen, naproxen, or other                            non-steroidal anti-inflammatory drugs for 2 weeks                            after polyp removal. Ladene Artist, MD 10/27/2018 2:45:49 PM This report has been signed electronically.

## 2018-10-27 NOTE — Patient Instructions (Signed)
Information on polyps and hemorrhoids given to you today.  Await pathology results.  No aspirin, ibuprofen, naproxen or other non steroidal anti inflammatory medications for 2 weeks after polyp removal.  YOU HAD AN ENDOSCOPIC PROCEDURE TODAY AT [facility]:   Refer to the procedure report that was given to you for any specific questions about what was found during the examination.  If the procedure report does not answer your questions, please call your gastroenterologist to clarify.  If you requested that your care partner not be given the details of your procedure findings, then the procedure report has been included in a sealed envelope for you to review at your convenience later.  YOU SHOULD EXPECT: Some feelings of bloating in the abdomen. Passage of more gas than usual.  Walking can help get rid of the air that was put into your GI tract during the procedure and reduce the bloating. If you had a lower endoscopy (such as a colonoscopy or flexible sigmoidoscopy) you may notice spotting of blood in your stool or on the toilet paper. If you underwent a bowel prep for your procedure, you may not have a normal bowel movement for a few days.  Please Note:  You might notice some irritation and congestion in your nose or some drainage.  This is from the oxygen used during your procedure.  There is no need for concern and it should clear up in a day or so.  SYMPTOMS TO REPORT IMMEDIATELY:   Following lower endoscopy (colonoscopy or flexible sigmoidoscopy):  Excessive amounts of blood in the stool  Significant tenderness or worsening of abdominal pains  Swelling of the abdomen that is new, acute  Fever of 100F or higher   For urgent or emergent issues, a gastroenterologist can be reached at any hour by calling [phone].   DIET:  We do recommend a small meal at first, but then you may proceed to your regular diet.  Drink plenty of fluids but you should avoid alcoholic  beverages for 24 hours.  ACTIVITY:  You should plan to take it easy for the rest of today and you should NOT DRIVE or use heavy machinery until tomorrow (because of the sedation medicines used during the test).    FOLLOW UP: Our staff will call the number listed on your records 48-72 hours following your procedure to check on you and address any questions or concerns that you may have regarding the information given to you following your procedure. If we do not reach you, we will leave a message.  We will attempt to reach you two times.  During this call, we will ask if you have developed any symptoms of COVID 19. If you develop any symptoms (ie: fever, flu-like symptoms, shortness of breath, cough etc.) before then, please call [phone].  If you test positive for Covid 19 in the 2 weeks post procedure, please call and report this information to [place].    If any biopsies were taken you will be contacted by phone or by letter within the next 1-3 weeks.  Please call us at [phone] if you have not heard about the biopsies in 3 weeks.    SIGNATURES/CONFIDENTIALITY: You and/or your care partner have signed paperwork which will be entered into your electronic medical record.  These signatures attest to the fact that that the information above on your After Visit Summary has been reviewed and is understood.  Full responsibility of the confidentiality of this discharge information lies  with you and/or your care-partner.

## 2018-10-27 NOTE — Progress Notes (Signed)
Pt's states no medical or surgical changes since previsit or office visit. 

## 2018-10-27 NOTE — Progress Notes (Signed)
To PACU, VSS. Report to [email or web address] 

## 2018-10-29 ENCOUNTER — Telehealth: Payer: Self-pay

## 2018-10-29 NOTE — Telephone Encounter (Signed)
  Follow up Call-  Call back number 10/27/2018  Post procedure Call Back phone  # [phone]  Permission to leave phone message Yes     Patient questions:  Do you have a fever, pain , or abdominal swelling? No. Pain Score  0 *  Have you tolerated food without any problems? Yes.    Have you been able to return to your normal activities? Yes.    Do you have any questions about your discharge instructions: Diet   No. Medications  No. Follow up visit  No.  Do you have questions or concerns about your Care? No.  Actions: * If pain score is 4 or above: 1. No action needed, pain <4.Have you developed a fever since your procedure? no  2.   Have you had an respiratory symptoms (SOB or cough) since your procedure? no  3.   Have you tested positive for COVID 19 since your procedure no  4.   Have you had any family members/close contacts diagnosed with the COVID 19 since your procedure?  no   If yes to any of these questions please route to Joylene John, RN and Alphonsa Gin, Therapist, sports.

## 2018-11-12 ENCOUNTER — Other Ambulatory Visit: Payer: Self-pay

## 2018-11-12 ENCOUNTER — Encounter (HOSPITAL_BASED_OUTPATIENT_CLINIC_OR_DEPARTMENT_OTHER): Payer: Self-pay | Admitting: *Deleted

## 2018-11-16 ENCOUNTER — Encounter: Payer: Self-pay | Admitting: Gastroenterology

## 2018-11-17 ENCOUNTER — Other Ambulatory Visit: Payer: Self-pay

## 2018-11-17 ENCOUNTER — Other Ambulatory Visit (HOSPITAL_COMMUNITY)
Admission: RE | Admit: 2018-11-17 | Discharge: 2018-11-17 | Disposition: A | Discharge status: Home / Self Care | Payer: 59 | Source: Ambulatory Visit | Attending: Otolaryngology | Admitting: Otolaryngology

## 2018-11-17 DIAGNOSIS — Z20828 Contact with and (suspected) exposure to other viral communicable diseases: Secondary | ICD-10-CM | POA: Insufficient documentation

## 2018-11-17 DIAGNOSIS — Z01812 Encounter for preprocedural laboratory examination: Secondary | ICD-10-CM | POA: Insufficient documentation

## 2018-11-18 LAB — NOVEL CORONAVIRUS, NAA (HOSP ORDER, SEND-OUT TO REF LAB; TAT 18-24 HRS): SARS-CoV-2, NAA: NOT DETECTED

## 2018-11-20 ENCOUNTER — Encounter (HOSPITAL_BASED_OUTPATIENT_CLINIC_OR_DEPARTMENT_OTHER)
Admission: RE | Disposition: A | Discharge status: Home / Self Care | Payer: Self-pay | Source: Home / Self Care | Attending: Otolaryngology

## 2018-11-20 ENCOUNTER — Ambulatory Visit (HOSPITAL_BASED_OUTPATIENT_CLINIC_OR_DEPARTMENT_OTHER)
Admission: RE | Admit: 2018-11-20 | Discharge: 2018-11-20 | Disposition: A | Discharge status: Home / Self Care | Payer: 59 | Attending: Otolaryngology | Admitting: Otolaryngology

## 2018-11-20 ENCOUNTER — Ambulatory Visit (HOSPITAL_BASED_OUTPATIENT_CLINIC_OR_DEPARTMENT_OTHER): Payer: 59 | Admitting: Certified Registered"

## 2018-11-20 ENCOUNTER — Encounter (HOSPITAL_BASED_OUTPATIENT_CLINIC_OR_DEPARTMENT_OTHER): Payer: Self-pay | Admitting: *Deleted

## 2018-11-20 DIAGNOSIS — J342 Deviated nasal septum: Secondary | ICD-10-CM | POA: Insufficient documentation

## 2018-11-20 DIAGNOSIS — J343 Hypertrophy of nasal turbinates: Secondary | ICD-10-CM | POA: Diagnosis not present

## 2018-11-20 DIAGNOSIS — J3489 Other specified disorders of nose and nasal sinuses: Secondary | ICD-10-CM | POA: Diagnosis not present

## 2018-11-20 HISTORY — PX: NASAL SEPTOPLASTY W/ TURBINOPLASTY: SHX2070

## 2018-11-20 HISTORY — DX: Alcohol abuse, uncomplicated: F10.10

## 2018-11-20 SURGERY — SEPTOPLASTY, NOSE, WITH NASAL TURBINATE REDUCTION
Anesthesia: General | Site: Nose | Laterality: Bilateral

## 2018-11-20 MED ORDER — LACTATED RINGERS IV SOLN
INTRAVENOUS | Status: DC
  Administered 2018-11-20 (×3): via INTRAVENOUS

## 2018-11-20 MED ORDER — PROPOFOL 10 MG/ML IV BOLUS
INTRAVENOUS | Status: AC
  Filled 2018-11-20: qty 40

## 2018-11-20 MED ORDER — OXYMETAZOLINE HCL 0.05 % NA SOLN
NASAL | Status: DC | PRN
  Administered 2018-11-20: 1 via TOPICAL

## 2018-11-20 MED ORDER — OXYCODONE-ACETAMINOPHEN 5-325 MG PO TABS: 1.0000 | ORAL_TABLET | ORAL | 0 refills | Status: AC | PRN

## 2018-11-20 MED ORDER — OXYMETAZOLINE HCL 0.05 % NA SOLN
NASAL | Status: AC
  Filled 2018-11-20: qty 30

## 2018-11-20 MED ORDER — LIDOCAINE-EPINEPHRINE 1 %-1:100000 IJ SOLN
INTRAMUSCULAR | Status: DC | PRN
  Administered 2018-11-20: 2.5 mL

## 2018-11-20 MED ORDER — LIDOCAINE 2% (20 MG/ML) 5 ML SYRINGE
INTRAMUSCULAR | Status: AC
  Filled 2018-11-20: qty 5

## 2018-11-20 MED ORDER — SUCCINYLCHOLINE CHLORIDE 20 MG/ML IJ SOLN
INTRAMUSCULAR | Status: DC | PRN
  Administered 2018-11-20: 100 mg via INTRAVENOUS

## 2018-11-20 MED ORDER — FENTANYL CITRATE (PF) 100 MCG/2ML IJ SOLN: 25.0000 ug | INTRAMUSCULAR | Status: DC | PRN

## 2018-11-20 MED ORDER — OXYCODONE HCL 5 MG/5ML PO SOLN: 5.0000 mg | Freq: Once | ORAL | Status: DC | PRN

## 2018-11-20 MED ORDER — AMOXICILLIN 875 MG PO TABS: 875.0000 mg | ORAL_TABLET | Freq: Two times a day (BID) | ORAL | 0 refills | Status: AC

## 2018-11-20 MED ORDER — FENTANYL CITRATE (PF) 100 MCG/2ML IJ SOLN
50.0000 ug | INTRAMUSCULAR | Status: DC | PRN
  Administered 2018-11-20: 100 ug via INTRAVENOUS

## 2018-11-20 MED ORDER — DEXAMETHASONE SODIUM PHOSPHATE 10 MG/ML IJ SOLN
INTRAMUSCULAR | Status: AC
  Filled 2018-11-20: qty 1

## 2018-11-20 MED ORDER — MIDAZOLAM HCL 2 MG/2ML IJ SOLN
1.0000 mg | INTRAMUSCULAR | Status: DC | PRN
  Administered 2018-11-20: 2 mg via INTRAVENOUS

## 2018-11-20 MED ORDER — OXYCODONE HCL 5 MG PO TABS: 5.0000 mg | ORAL_TABLET | Freq: Once | ORAL | Status: DC | PRN

## 2018-11-20 MED ORDER — FENTANYL CITRATE (PF) 100 MCG/2ML IJ SOLN
INTRAMUSCULAR | Status: AC
  Filled 2018-11-20: qty 2

## 2018-11-20 MED ORDER — MUPIROCIN 2 % EX OINT
TOPICAL_OINTMENT | CUTANEOUS | Status: AC
  Filled 2018-11-20: qty 22

## 2018-11-20 MED ORDER — LABETALOL HCL 5 MG/ML IV SOLN
INTRAVENOUS | Status: AC
  Filled 2018-11-20: qty 4

## 2018-11-20 MED ORDER — PROPOFOL 10 MG/ML IV BOLUS
INTRAVENOUS | Status: DC | PRN
  Administered 2018-11-20: 260 mg via INTRAVENOUS

## 2018-11-20 MED ORDER — MUPIROCIN 2 % EX OINT
TOPICAL_OINTMENT | CUTANEOUS | Status: DC | PRN
  Administered 2018-11-20: 1 via NASAL

## 2018-11-20 MED ORDER — LABETALOL HCL 5 MG/ML IV SOLN
10.0000 mg | INTRAVENOUS | Status: DC | PRN
  Administered 2018-11-20: 10 mg via INTRAVENOUS

## 2018-11-20 MED ORDER — PHENYLEPHRINE HCL (PRESSORS) 10 MG/ML IV SOLN
INTRAVENOUS | Status: DC | PRN
  Administered 2018-11-20: 120 ug via INTRAVENOUS

## 2018-11-20 MED ORDER — ONDANSETRON HCL 4 MG/2ML IJ SOLN
4.0000 mg | Freq: Four times a day (QID) | INTRAMUSCULAR | Status: AC | PRN
  Administered 2018-11-20: 08:00:00 4 mg via INTRAVENOUS

## 2018-11-20 MED ORDER — LIDOCAINE HCL (CARDIAC) PF 100 MG/5ML IV SOSY
PREFILLED_SYRINGE | INTRAVENOUS | Status: DC | PRN
  Administered 2018-11-20: 100 mg via INTRAVENOUS

## 2018-11-20 MED ORDER — CEFAZOLIN SODIUM-DEXTROSE 1-4 GM/50ML-% IV SOLN
INTRAVENOUS | Status: DC | PRN
  Administered 2018-11-20: 2 g via INTRAVENOUS

## 2018-11-20 MED ORDER — ONDANSETRON HCL 4 MG/2ML IJ SOLN
INTRAMUSCULAR | Status: AC
  Filled 2018-11-20: qty 2

## 2018-11-20 MED ORDER — LIDOCAINE-EPINEPHRINE 1 %-1:100000 IJ SOLN
INTRAMUSCULAR | Status: AC
  Filled 2018-11-20: qty 1

## 2018-11-20 MED ORDER — DEXAMETHASONE SODIUM PHOSPHATE 4 MG/ML IJ SOLN
INTRAMUSCULAR | Status: DC | PRN
  Administered 2018-11-20: 10 mg via INTRAVENOUS

## 2018-11-20 MED ORDER — MIDAZOLAM HCL 2 MG/2ML IJ SOLN
INTRAMUSCULAR | Status: AC
  Filled 2018-11-20: qty 2

## 2018-11-20 SURGICAL SUPPLY — 35 items
ATTRACTOMAT 16X20 MAGNETIC DRP (DRAPES) IMPLANT
CANISTER SUCT 1200ML W/VALVE (MISCELLANEOUS) ×3 IMPLANT
COAGULATOR SUCT 8FR VV (MISCELLANEOUS) ×3 IMPLANT
COVER WAND RF STERILE (DRAPES) IMPLANT
DECANTER SPIKE VIAL GLASS SM (MISCELLANEOUS) ×3 IMPLANT
DRSG NASOPORE 8CM (GAUZE/BANDAGES/DRESSINGS) IMPLANT
DRSG TELFA 3X8 NADH (GAUZE/BANDAGES/DRESSINGS) IMPLANT
ELECT REM PT RETURN 9FT ADLT (ELECTROSURGICAL) ×3
ELECTRODE REM PT RTRN 9FT ADLT (ELECTROSURGICAL) ×1 IMPLANT
GLOVE BIO SURGEON STRL SZ7.5 (GLOVE) ×3 IMPLANT
GLOVE BIOGEL PI IND STRL 7.0 (GLOVE) ×2 IMPLANT
GLOVE BIOGEL PI INDICATOR 7.0 (GLOVE) ×4
GLOVE ECLIPSE 6.5 STRL STRAW (GLOVE) ×3 IMPLANT
GLOVE SURG SS PI 7.0 STRL IVOR (GLOVE) ×3 IMPLANT
GOWN STRL REUS W/ TWL LRG LVL3 (GOWN DISPOSABLE) ×3 IMPLANT
GOWN STRL REUS W/TWL LRG LVL3 (GOWN DISPOSABLE) ×6
NEEDLE HYPO 25X1 1.5 SAFETY (NEEDLE) ×3 IMPLANT
NS IRRIG 1000ML POUR BTL (IV SOLUTION) ×3 IMPLANT
PACK BASIN DAY SURGERY FS (CUSTOM PROCEDURE TRAY) ×3 IMPLANT
PACK ENT DAY SURGERY (CUSTOM PROCEDURE TRAY) ×3 IMPLANT
SLEEVE SCD COMPRESS KNEE MED (MISCELLANEOUS) ×3 IMPLANT
SOLUTION BUTLER CLEAR DIP (MISCELLANEOUS) ×3 IMPLANT
SPLINT NASAL AIRWAY SILICONE (MISCELLANEOUS) ×3 IMPLANT
SPONGE GAUZE 2X2 8PLY STER LF (GAUZE/BANDAGES/DRESSINGS) ×1
SPONGE GAUZE 2X2 8PLY STRL LF (GAUZE/BANDAGES/DRESSINGS) ×2 IMPLANT
SPONGE NEURO XRAY DETECT 1X3 (DISPOSABLE) ×3 IMPLANT
SUT CHROMIC 4 0 P 3 18 (SUTURE) ×3 IMPLANT
SUT PLAIN 4 0 ~~LOC~~ 1 (SUTURE) ×3 IMPLANT
SUT PROLENE 3 0 PS 2 (SUTURE) ×3 IMPLANT
SUT VIC AB 4-0 P-3 18XBRD (SUTURE) IMPLANT
SUT VIC AB 4-0 P3 18 (SUTURE)
TOWEL GREEN STERILE FF (TOWEL DISPOSABLE) ×3 IMPLANT
TUBE SALEM SUMP 12R W/ARV (TUBING) IMPLANT
TUBE SALEM SUMP 16 FR W/ARV (TUBING) ×3 IMPLANT
YANKAUER SUCT BULB TIP NO VENT (SUCTIONS) ×3 IMPLANT

## 2018-11-20 NOTE — Anesthesia Procedure Notes (Signed)
Procedure Name: [place] Performed by: Verita Lamb, CRNA Pre-anesthesia Checklist: Emergency Drugs available, Patient identified, Suction available, Patient being monitored and Timeout performed Patient Re-evaluated:Patient Re-evaluated prior to induction Oxygen Delivery Method: Circle system utilized Preoxygenation: Pre-oxygenation with 100% oxygen Induction Type: IV induction Ventilation: Mask ventilation without difficulty Laryngoscope Size: Mac and 4 Grade View: Grade IV Tube type: Oral Number of attempts: 1 Airway Equipment and Method: Stylet Placement Confirmation: ETT inserted through vocal cords under direct vision,  positive ETCO2,  CO2 detector and breath sounds checked- equal and bilateral Secured at: 24 cm Tube secured with: Tape Dental Injury: Teeth and Oropharynx as per pre-operative assessment  Comments: Grade 3 view on initial attempt with mac 4 blade.  Did not attempt to pass ett.  Dr. Marcie Bal was able to see arytenoids only.

## 2018-11-20 NOTE — Anesthesia Preprocedure Evaluation (Signed)
Anesthesia Evaluation  Patient identified by MRN, date of birth, ID band Patient awake    Reviewed: Allergy & Precautions, H&P , NPO status , Patient's Chart, lab work & pertinent test results  Airway Mallampati: II   Neck ROM: full    Dental   Pulmonary Current Smoker,    breath sounds clear to auscultation       Cardiovascular hypertension,  Rhythm:regular Rate:Normal     Neuro/Psych    GI/Hepatic GERD  ,(+)     substance abuse  alcohol use,   Endo/Other    Renal/GU      Musculoskeletal   Abdominal   Peds  Hematology   Anesthesia Other Findings   Reproductive/Obstetrics                             Anesthesia Physical Anesthesia Plan  ASA: II  Anesthesia Plan: General   Post-op Pain Management:    Induction: Intravenous  PONV Risk Score and Plan: 1 and Ondansetron, Dexamethasone, Midazolam and Treatment may vary due to age or medical condition  Airway Management Planned: Oral ETT  Additional Equipment:   Intra-op Plan:   Post-operative Plan: Extubation in OR  Informed Consent: I have reviewed the patients History and Physical, chart, labs and discussed the procedure including the risks, benefits and alternatives for the proposed anesthesia with the patient or authorized representative who has indicated his/her understanding and acceptance.       Plan Discussed with: CRNA, Surgeon and Anesthesiologist  Anesthesia Plan Comments:         Anesthesia Quick Evaluation

## 2018-11-20 NOTE — Discharge Instructions (Addendum)
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 ____ School. ____ Physical activity. This is effective for the following dates: __9/10/2018 thru 9/15/2020__. He or she may return to work on _9/16/2020__.  Health care provider: __Su Raynelle Bring, MD______ Date: ___9/11/2020_______ This information is not intended to replace advice given to you by your health care provider. Make sure you discuss any questions you have with your health care provider. Document Released: 08/21/2000 Document Revised: 02/20/2017 Document Reviewed: 02/20/2017 Elsevier Patient Education  2020 [employer].   -------------------------  POSTOPERATIVE INSTRUCTIONS FOR PATIENTS HAVING NASAL OR SINUS OPERATIONS ACTIVITY: Restrict activity at home for the first two days, resting as much as possible. Light activity is best. You may usually return to work within a week. You should refrain from nose blowing, strenuous activity, or heavy lifting greater than 20lbs for a total of 5 days after your operation.  If sneezing cannot be avoided, sneeze with your mouth open. DISCOMFORT: You may experience a dull headache and pressure along with nasal congestion and discharge. These symptoms may be worse during the first week after the operation but may last as long as two to four weeks.  Please take Tylenol or the pain medication that has been prescribed for you. Do not take aspirin or aspirin containing medications since they may cause bleeding.  You may experience symptoms of post nasal drainage, nasal congestion, headaches and fatigue for two or three months after your operation.  BLEEDING: You may have some blood tinged nasal drainage for approximately two weeks after the operation.  The discharge will be worse for the first week.  Please call our office at [phone] or go to the nearest hospital emergency room if you experience any of the following: heavy, bright red blood from your  nose or mouth that lasts longer than 15 minutes or coughing up or vomiting bright red blood or blood clots. GENERAL CONSIDERATIONS: 1. A gauze dressing will be placed on your upper lip to absorb any drainage after the operation. You may need to change this several times a day.  If you do not have very much drainage, you may remove the dressing.  Remember that you may gently wipe your nose with a tissue and sniff in, but DO NOT blow your nose. 2. Please keep all of your postoperative appointments.  Your final results after the operation will depend on proper follow-up.  The initial visit is usually 2 to 5 days after the operation.  During this visit, the remaining nasal packing and internal septal splints will be removed.  Your nasal and sinus cavities will be cleaned.  During the second visit, your nasal and sinus cavities will be cleaned again. Have someone drive you to your first two postoperative appointments.  3. How you care for your nose after the operation will influence the results that you obtain.  You should follow all directions, take your medication as prescribed, and call our office [phone] with any problems or questions. 4. You may be more comfortable sleeping with your head elevated on two pillows. 5. Do not take any medications that we have not prescribed or recommended. WARNING SIGNS: if any of the following should occur, please call our office: 1. Persistent fever greater than 102F. 2. Persistent vomiting. 3. Severe and constant pain that is not relieved by prescribed pain medication. 4. Trauma to the nose. 5. Rash or unusual side effects from any medicines.     [facility]  Care Instructions  Activity: Get plenty of rest for the remainder of the day. A responsible individual must stay with you for 24 hours following the procedure.  For the next 24 hours, DO NOT: -Drive a car -Paediatric nurse -Drink alcoholic beverages -Take any medication unless instructed  by your physician -Make any legal decisions or sign important papers.  Meals: Start with liquid foods such as gelatin or soup. Progress to regular foods as tolerated. Avoid greasy, spicy, heavy foods. If nausea and/or vomiting occur, drink only clear liquids until the nausea and/or vomiting subsides. Call your physician if vomiting continues.  Special Instructions/Symptoms: Your throat may feel dry or sore from the anesthesia or the breathing tube placed in your throat during surgery. If this causes discomfort, gargle with warm salt water. The discomfort should disappear within 24 hours.  If you had a scopolamine patch placed behind your ear for the management of post- operative nausea and/or vomiting:  1. The medication in the patch is effective for 72 hours, after which it should be removed.  Wrap patch in a tissue and discard in the trash. Wash hands thoroughly with soap and water. 2. You may remove the patch earlier than 72 hours if you experience unpleasant side effects which may include dry mouth, dizziness or visual disturbances. 3. Avoid touching the patch. Wash your hands with soap and water after contact with the patch.     Post Anesthesia Home Care Instructions  Activity: Get plenty of rest for the remainder of the day. A responsible individual must stay with you for 24 hours following the procedure.  For the next 24 hours, DO NOT: -Drive a car -Paediatric nurse -Drink alcoholic beverages -Take any medication unless instructed by your physician -Make any legal decisions or sign important papers.  Meals: Start with liquid foods such as gelatin or soup. Progress to regular foods as tolerated. Avoid greasy, spicy, heavy foods. If nausea and/or vomiting occur, drink only clear liquids until the nausea and/or vomiting subsides. Call your physician if vomiting continues.  Special Instructions/Symptoms: Your throat may feel dry or sore from the anesthesia or the breathing tube  placed in your throat during surgery. If this causes discomfort, gargle with warm salt water. The discomfort should disappear within 24 hours.  If you had a scopolamine patch placed behind your ear for the management of post- operative nausea and/or vomiting:  1. The medication in the patch is effective for 72 hours, after which it should be removed.  Wrap patch in a tissue and discard in the trash. Wash hands thoroughly with soap and water. 2. You may remove the patch earlier than 72 hours if you experience unpleasant side effects which may include dry mouth, dizziness or visual disturbances. 3. Avoid touching the patch. Wash your hands with soap and water after contact with the patch.

## 2018-11-20 NOTE — H&P (Signed)
Cc: Chronic nasal obstruction  HPI: The patient is a 53 y/o male who returns today for follow up evaluation of chronic nasal congestion. The patient was last seen 6 weeks ago. At that time, he was noted to have significant nasal mucosal congestion with septal deviation, septal spur, and bilateral inferior turbinate hypertrophy. The patient was placed on a prednisone taper and daily Flonase. The patient returns today noting a slight improvement in his congestion. He still has difficulty breathing through his nose. The patient  denies sinus pressure or pain. No other ENT, GI, or respiratory issue noted since the last visit.   Exam: The flexible scope was inserted into the right nasal cavity. NSD with spur. Endoscopy of the inferior and middle meatus was performed.  Edematous mucosa was noted.  No polyp, mass, or lesion was appreciated.  Olfactory cleft was clear.  Nasopharynx was clear.  Turbinates were hypertrophied but without mass.  Incomplete response to decongestion.  The procedure was repeated on the contralateral side with similar findings.  The patient tolerated the procedure well.  Instructions were given to avoid eating or drinking for 2 hours.    Assessment: 1. Persistent nasal mucosal congestion is noted with septal deviation and bilateral inferior turbinate hypertrophy, causing significant nasal obstruction. 2. No purulent drainage, polyps, or other suspicious mass or lesion is noted on today's nasal endoscopy.  Plan:  1. Nasal endoscopy findings are reviewed with the patient.  2. Treatment options include conservative management with daily steroid nasal spray versus septoplasty and bilateral inferior turbinate reduction. The risks, benefits, alternatives, and details of the procedure are reviewed with the patient. Questions are invited and answered. 3. The patient would like to proceed with surgical intervention. The procedure will be scheduled in accordance with the patient's schedule.

## 2018-11-20 NOTE — Transfer of Care (Signed)
Immediate Anesthesia Transfer of Care Note  Patient: Daniel Silva  Procedure(s) Performed: NASAL SEPTOPLASTY WITH BILATERAL  TURBINATE REDUCTION (Bilateral Nose)  Patient Location: PACU  Anesthesia Type:General  Level of Consciousness: awake, alert  and oriented  Airway & Oxygen Therapy: Patient Spontanous Breathing and Patient connected to face mask oxygen  Post-op Assessment: Report given to RN and Post -op Vital signs reviewed and stable  Post vital signs: Reviewed and stable  Last Vitals:  Vitals Value Taken Time  BP    Temp    Pulse    Resp    SpO2      Last Pain:  Vitals:   11/20/18 0641  TempSrc: Oral  PainSc: 0-No pain         Complications: No apparent anesthesia complications

## 2018-11-20 NOTE — Op Note (Signed)
DATE OF PROCEDURE: 11/20/2018  OPERATIVE REPORT   SURGEON: Leta Baptist, MD   PREOPERATIVE DIAGNOSES:  1. Severe nasal septal deviation.  2. Bilateral inferior turbinate hypertrophy.  3. Chronic nasal obstruction.  POSTOPERATIVE DIAGNOSES:  1. Severe nasal septal deviation.  2. Bilateral inferior turbinate hypertrophy.  3. Chronic nasal obstruction.  PROCEDURE PERFORMED:  1. Septoplasty.  2. Bilateral partial inferior turbinate resection.   ANESTHESIA: General endotracheal tube anesthesia.   COMPLICATIONS: None.   ESTIMATED BLOOD LOSS: 20 mL.   INDICATION FOR PROCEDURE: Daniel Silva is a 53 y.o. male with a history of chronic nasal obstruction. The patient was  treated with antihistamine, decongestant, steroid nasal spray, and systemic steroids. However, the patient continued to be symptomatic. On examination, the patient was noted to have bilateral severe inferior turbinate hypertrophy and significant nasal septal deviation, causing significant nasal obstruction. Based on the above findings, the decision was made for the patient to undergo the above-stated procedures. The risks, benefits, alternatives, and details of the procedures were discussed with the patient. Questions were invited and answered. Informed consent was obtained.   DESCRIPTION OF PROCEDURE: The patient was taken to the operating room and placed supine on the operating table. General endotracheal tube anesthesia was administered by the anesthesiologist. The patient was positioned, and prepped and draped in the standard fashion for nasal surgery. Pledgets soaked with Afrin were placed in both nasal cavities for decongestion. The pledgets were subsequently removed.  Examination of the nasal cavity revealed a severe nasal septal deviationd. 1% lidocaine with 1:100,000 epinephrine was injected onto the nasal septum bilaterally. A hemitransfixion incision was made on the left side. The mucosal flap was carefully elevated on the  left side. A cartilaginous incision was made 1 cm superior to the caudal margin of the nasal septum. Mucosal flap was also elevated on the right side in the similar fashion. It should be noted that due to the septal deviation, the deviated portion of the cartilaginous and bony septum had to be removed in piecemeal fashion. Once the deviated portions were removed, a straight midline septum was achieved. The septum was then quilted with 4-0 plain gut sutures. The hemitransfixion incision was closed with interrupted 4-0 chromic sutures.   The inferior one half of both hypertrophied inferior turbinate was crossclamped with a Kelly clamp. The inferior one half of each inferior turbinate was then resected with a pair of cross cutting scissors. Hemostasis was achieved with a suction cautery device. Doyle splints were applied to the nasal septum.  The care of the patient was turned over to the anesthesiologist. The patient was awakened from anesthesia without difficulty. The patient was extubated and transferred to the recovery room in good condition.   OPERATIVE FINDINGS: Nasal septal deviation and bilateral inferior turbinate hypertrophy.   SPECIMEN: None.   FOLLOWUP CARE: The patient be discharged home once he is awake and alert. The patient will be placed on Percocet 1 tablets p.o. q.4 hours p.r.n. pain, and amoxicillin 875 mg p.o. b.i.d. for 5 days. The patient will follow up in my office in approximately 1 week for splint removal.   Judieth Mckown Raynelle Bring, MD

## 2018-11-21 NOTE — Anesthesia Postprocedure Evaluation (Signed)
Anesthesia Post Note  Patient: Daniel Silva  Procedure(s) Performed: NASAL SEPTOPLASTY WITH BILATERAL  TURBINATE REDUCTION (Bilateral Nose)     Patient location during evaluation: PACU Anesthesia Type: General Level of consciousness: awake and alert Pain management: pain level controlled Vital Signs Assessment: post-procedure vital signs reviewed and stable Respiratory status: spontaneous breathing, nonlabored ventilation, respiratory function stable and patient connected to nasal cannula oxygen Cardiovascular status: blood pressure returned to baseline and stable Postop Assessment: no apparent nausea or vomiting Anesthetic complications: no    Last Vitals:  Vitals:   11/20/18 0930 11/20/18 0945  BP: (!) 154/103 (!) 155/99  Pulse: 95   Resp: 11   Temp:    SpO2: 99%     Last Pain:  Vitals:   11/20/18 0945  TempSrc:   PainSc: 0-No pain                 Dorna Mallet S

## 2018-11-23 ENCOUNTER — Encounter (HOSPITAL_BASED_OUTPATIENT_CLINIC_OR_DEPARTMENT_OTHER): Payer: Self-pay | Admitting: Otolaryngology

## 2018-12-01 ENCOUNTER — Encounter: Payer: Self-pay | Admitting: Family Medicine

## 2018-12-01 ENCOUNTER — Other Ambulatory Visit: Payer: Self-pay

## 2018-12-09 ENCOUNTER — Encounter: Payer: Self-pay | Admitting: Family Medicine

## 2018-12-13 ENCOUNTER — Other Ambulatory Visit: Payer: Self-pay | Admitting: Internal Medicine

## 2018-12-13 DIAGNOSIS — R972 Elevated prostate specific antigen [PSA]: Secondary | ICD-10-CM

## 2018-12-13 DIAGNOSIS — R7989 Other specified abnormal findings of blood chemistry: Secondary | ICD-10-CM

## 2018-12-13 DIAGNOSIS — N529 Male erectile dysfunction, unspecified: Secondary | ICD-10-CM

## 2019-02-16 DIAGNOSIS — E291 Testicular hypofunction: Secondary | ICD-10-CM | POA: Insufficient documentation

## 2019-02-16 DIAGNOSIS — R972 Elevated prostate specific antigen [PSA]: Secondary | ICD-10-CM | POA: Insufficient documentation

## 2020-10-20 DIAGNOSIS — J189 Pneumonia, unspecified organism: Secondary | ICD-10-CM | POA: Insufficient documentation

## 2021-03-13 IMAGING — CR CHEST - 2 VIEW
2 series · 2 of 2 positions shown · non-contrast
Comparison: None.

CLINICAL DATA: Midsternal chest pain

EXAM:
CHEST - 2 VIEW

[w chest pa]
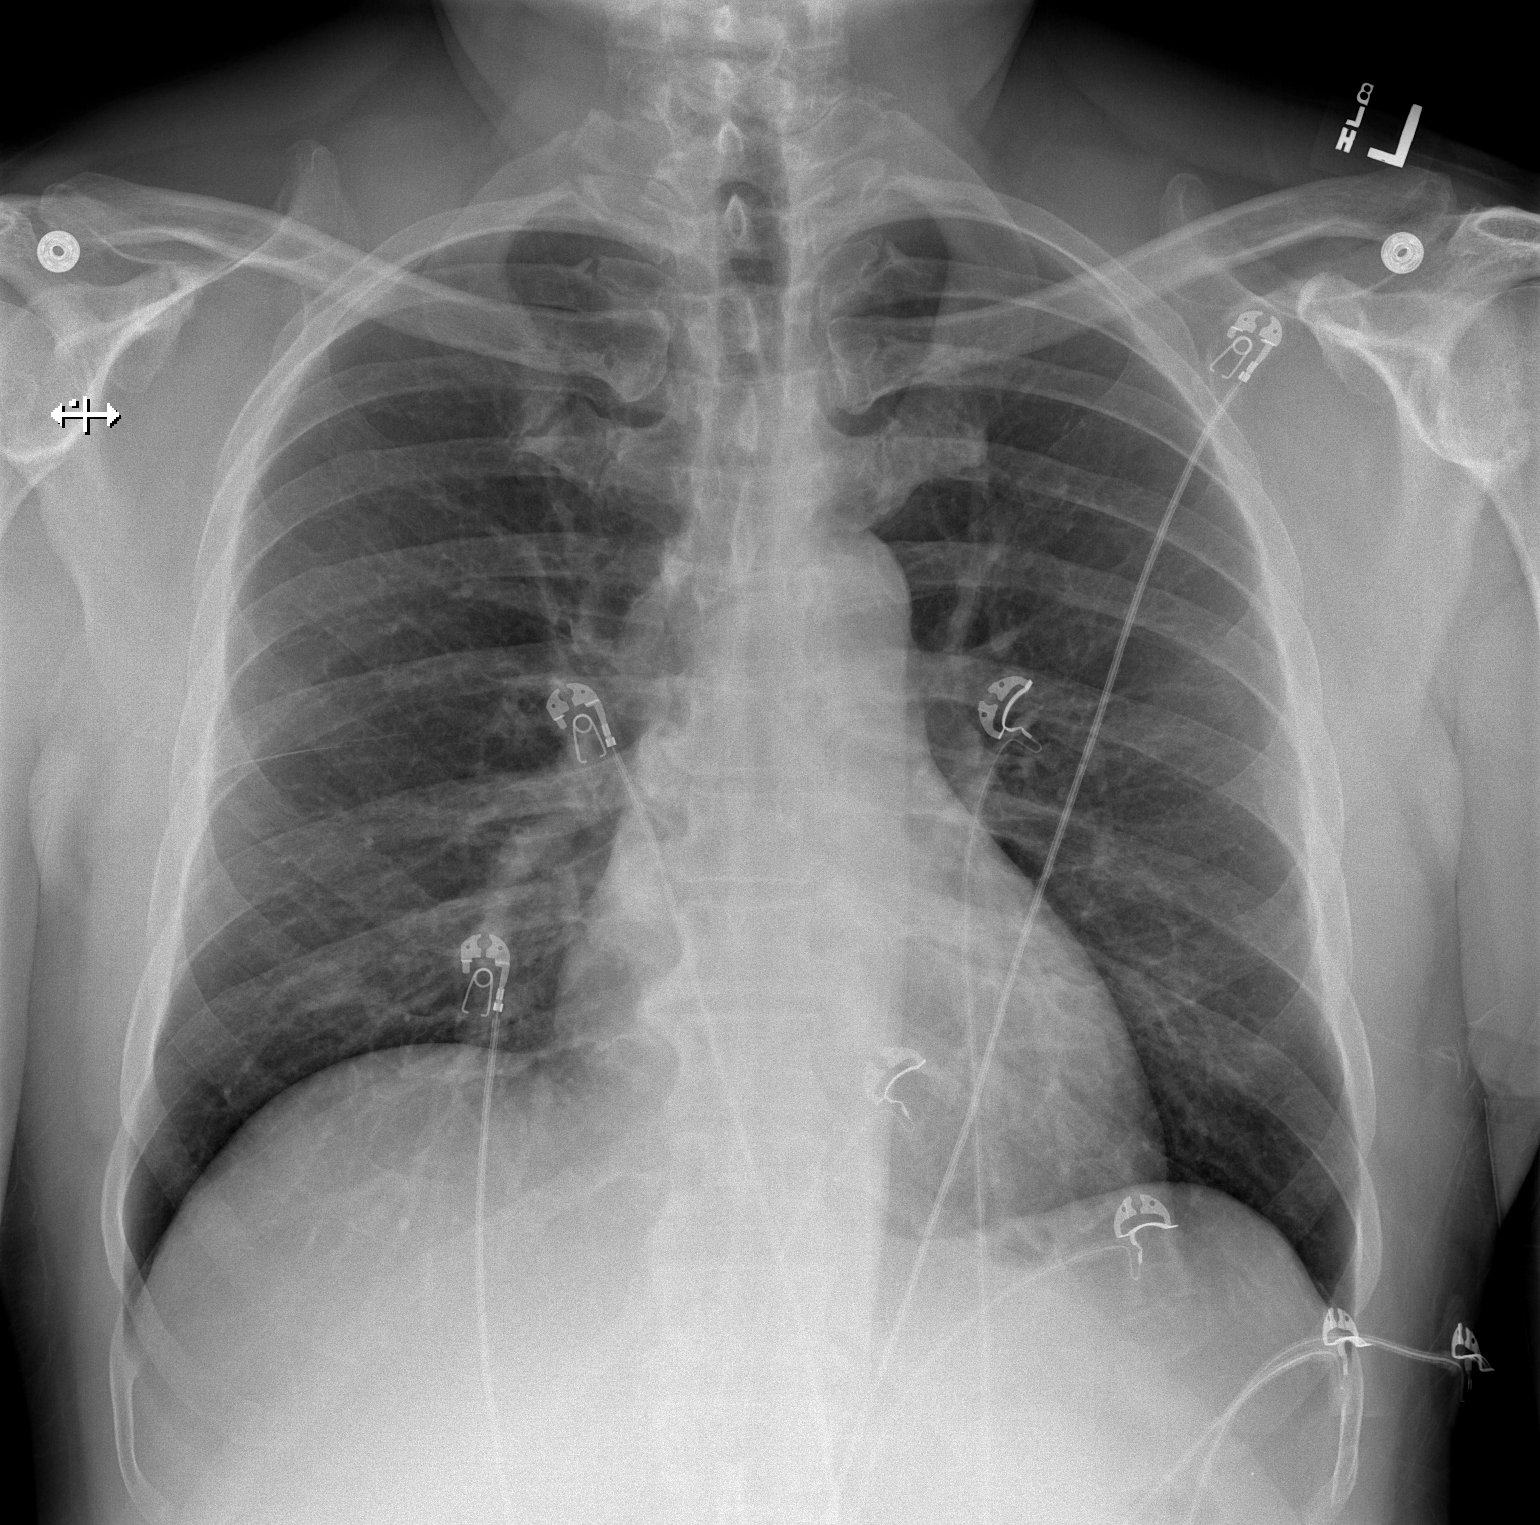

[w chest lat]
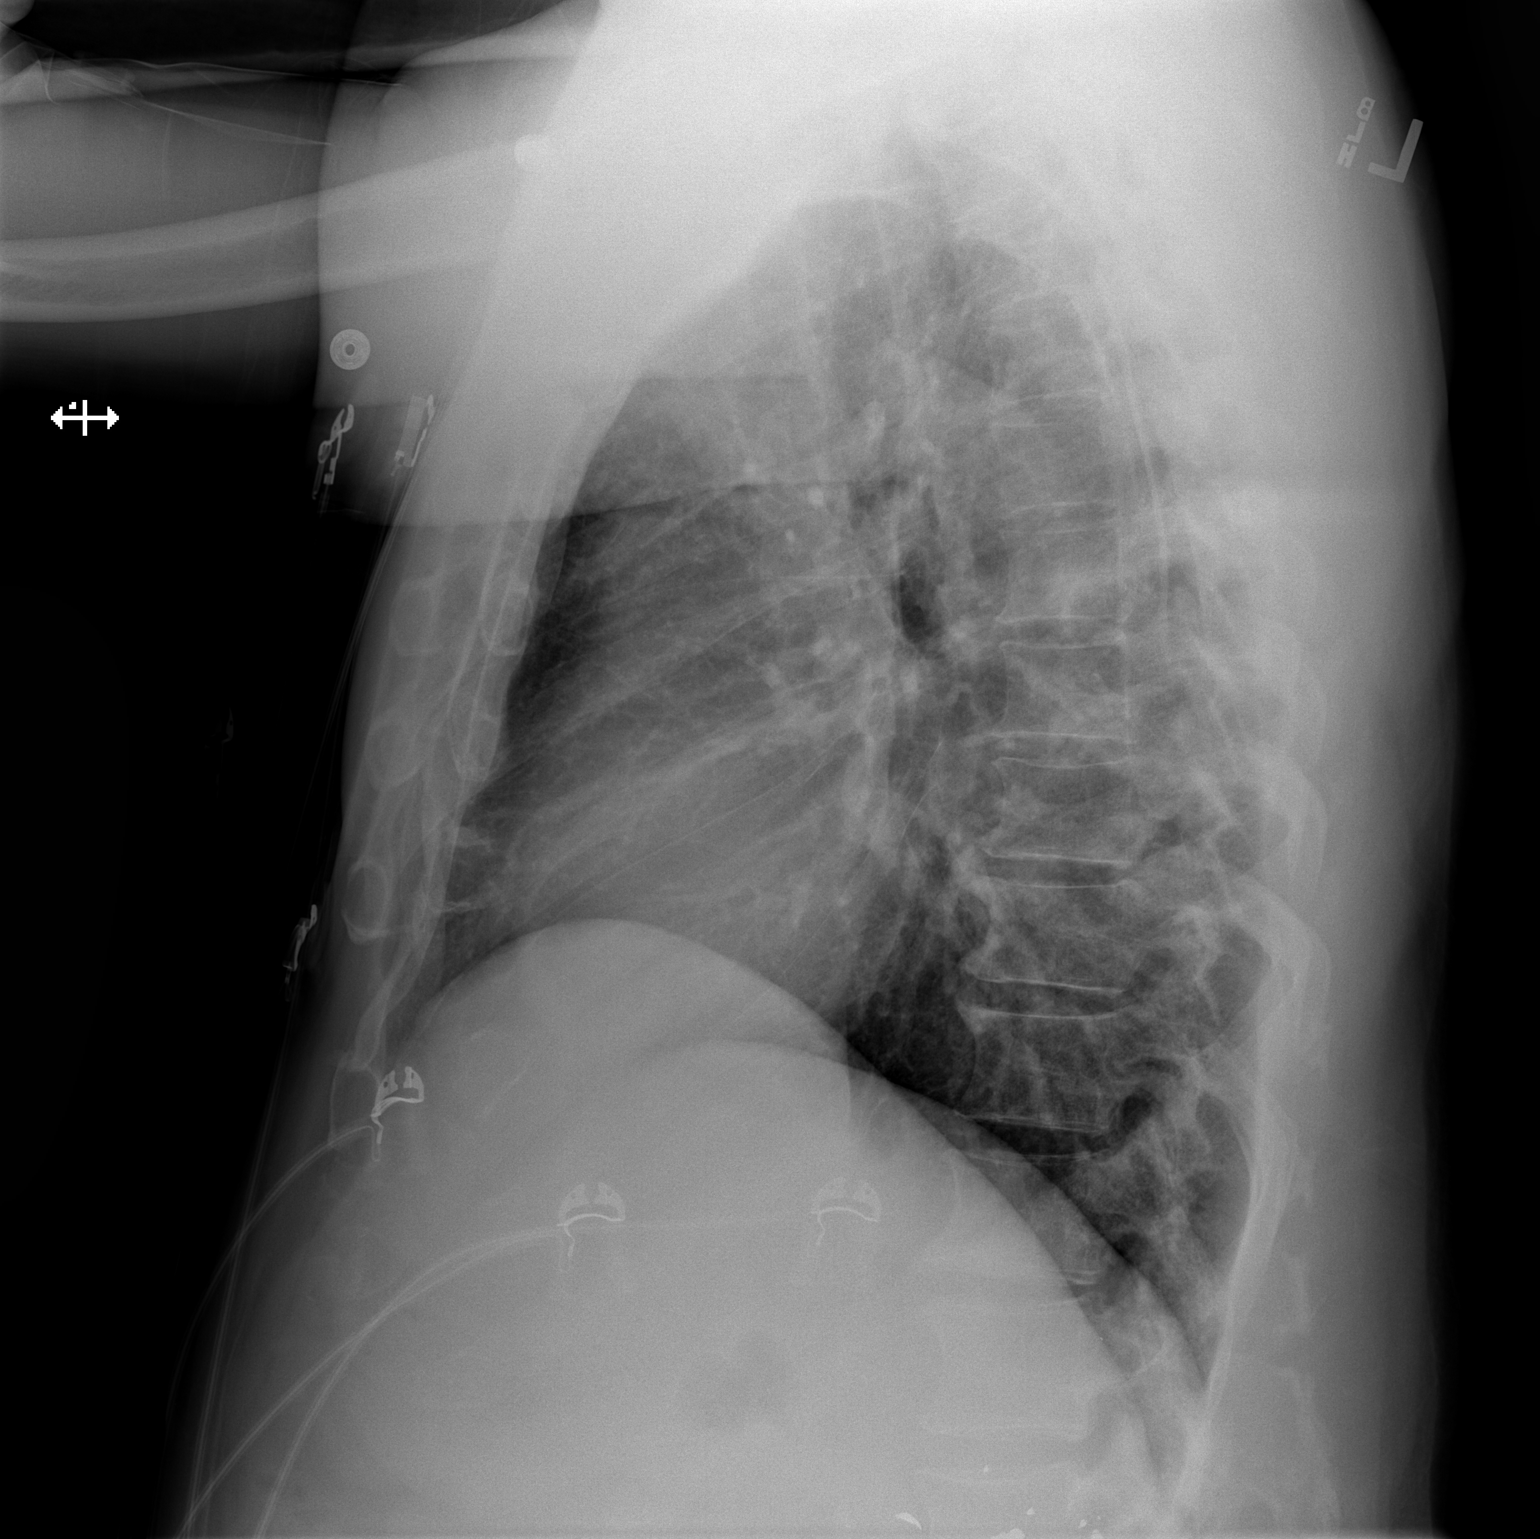

[2 of 2 positions shown; findings below may reference images not displayed]

FINDINGS: The heart size and mediastinal contours are within normal limits.
Both lungs are clear. The visualized skeletal structures are
unremarkable.
IMPRESSION: No active cardiopulmonary disease.

## 2021-04-24 IMAGING — US ULTRASOUND ABDOMEN LIMITED
1 series · 14 of 25 positions shown · non-contrast
Comparison: CT abdomen and pelvis 09/06/2018

CLINICAL DATA: Pancreatitis, mild elevation of LFTs, question
cholelithiasis

EXAM:
ULTRASOUND ABDOMEN LIMITED RIGHT UPPER QUADRANT

[Series 1: ultrasound abdomen limited · 14 of 154 slices shown]
[im 1/154]
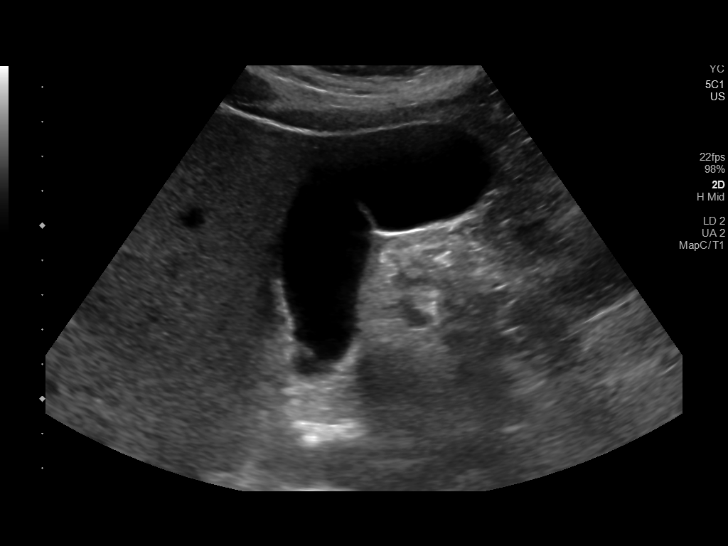
[im 13/154]
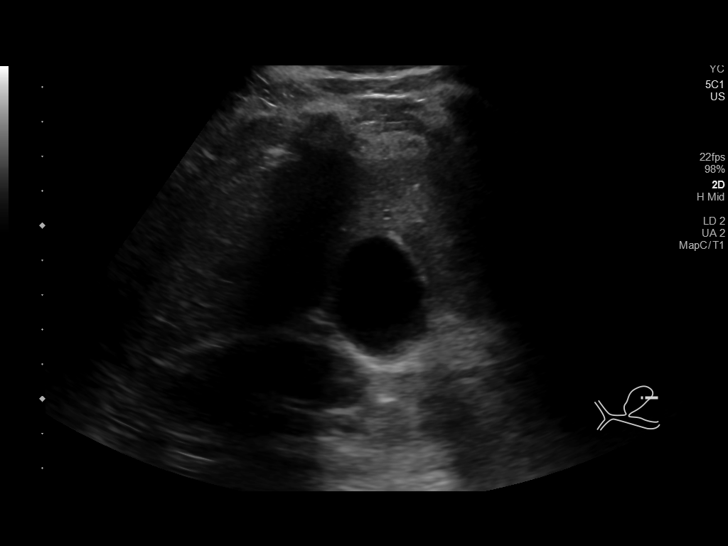
[im 26/154]
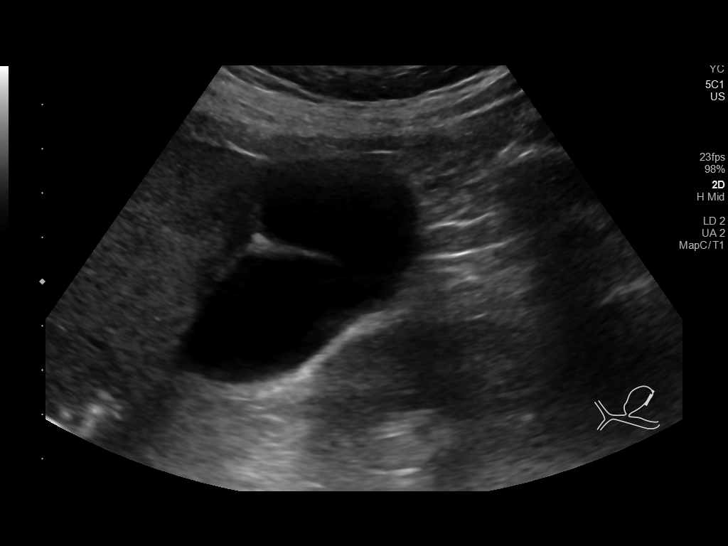
[im 39/154]
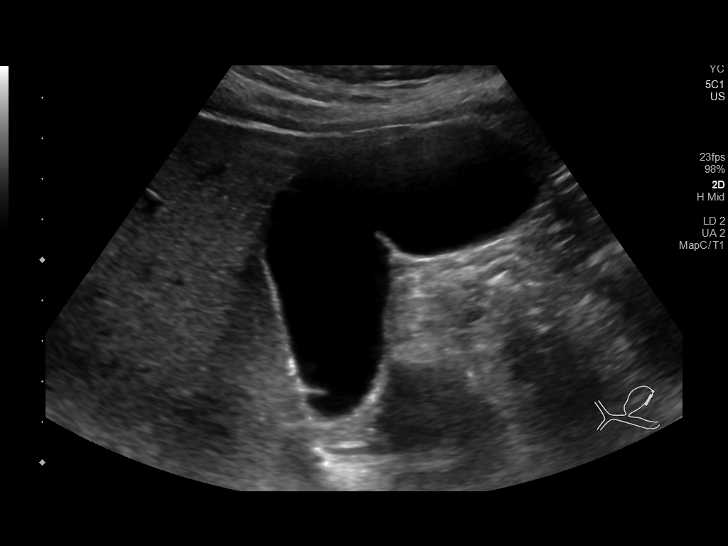
[im 52/154]
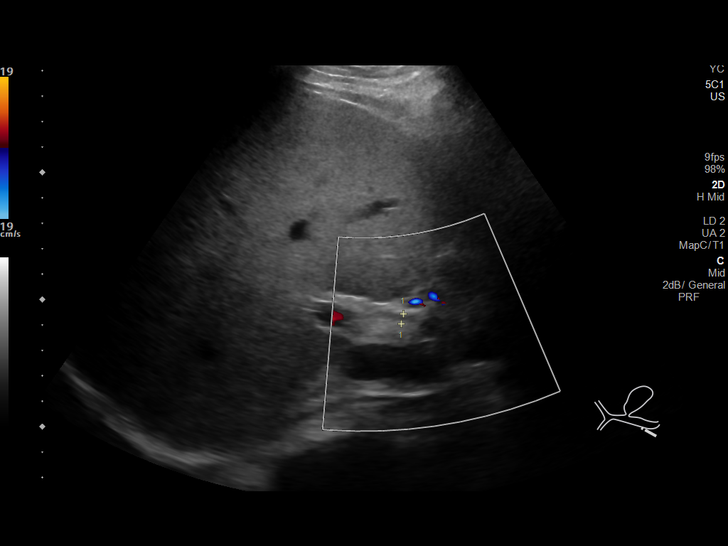
[im 58/154]
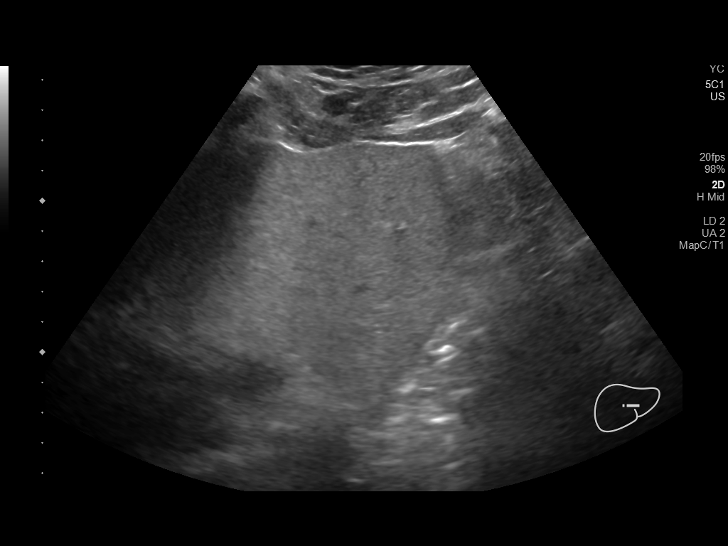
[im 71/154]
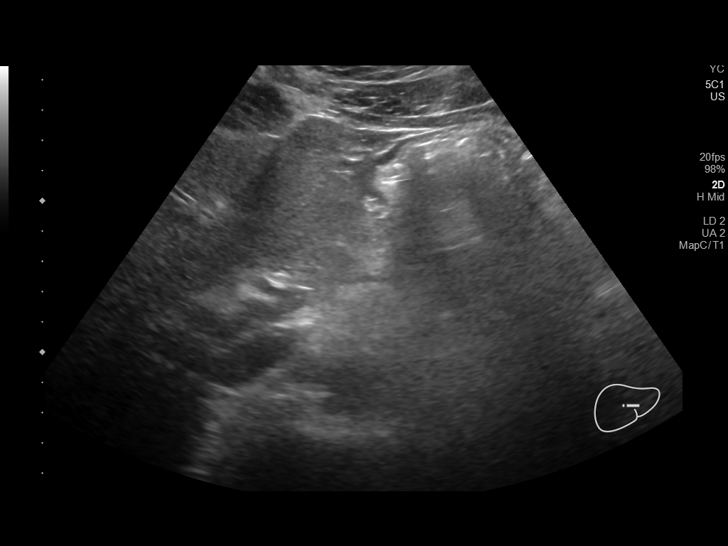
[im 83/154]
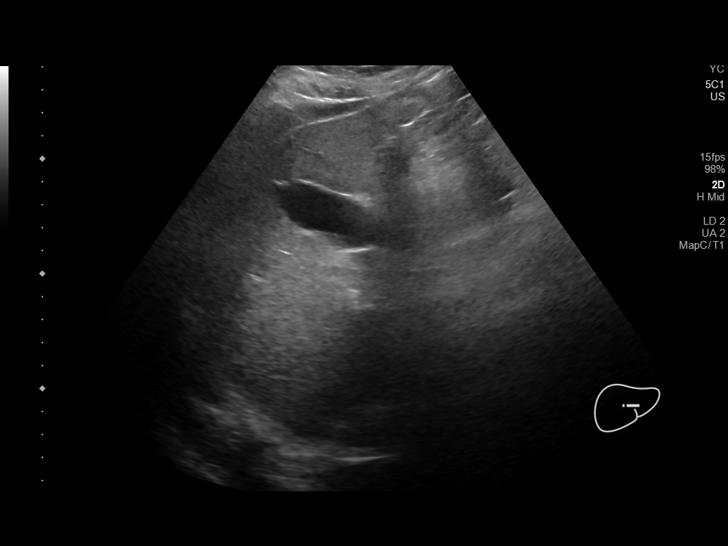
[im 96/154]
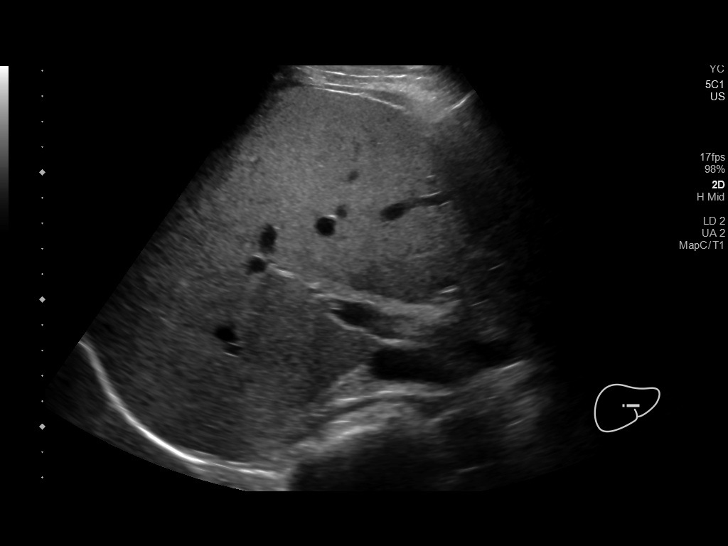
[im 103/154]
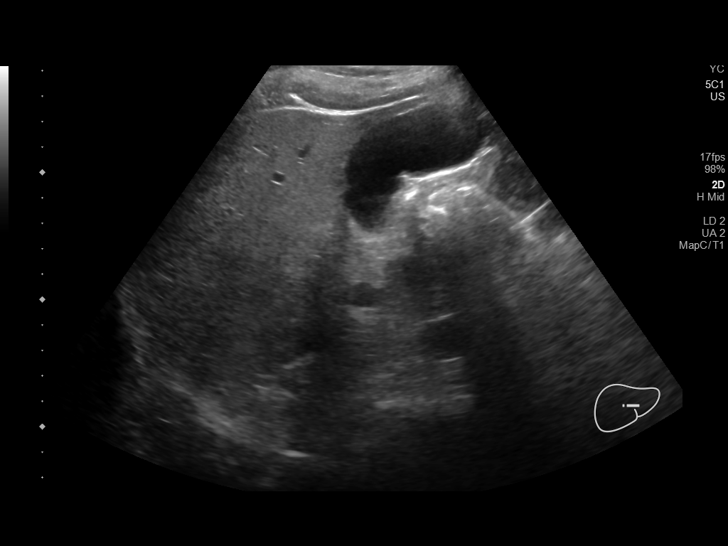
[im 115/154]
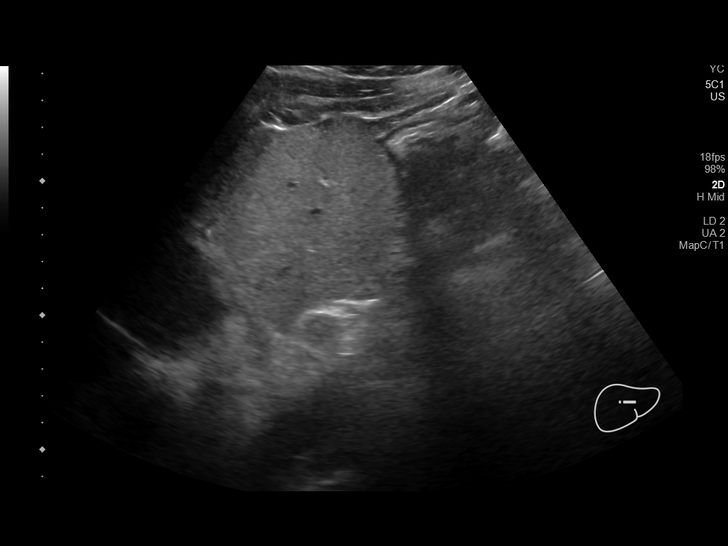
[im 128/154]
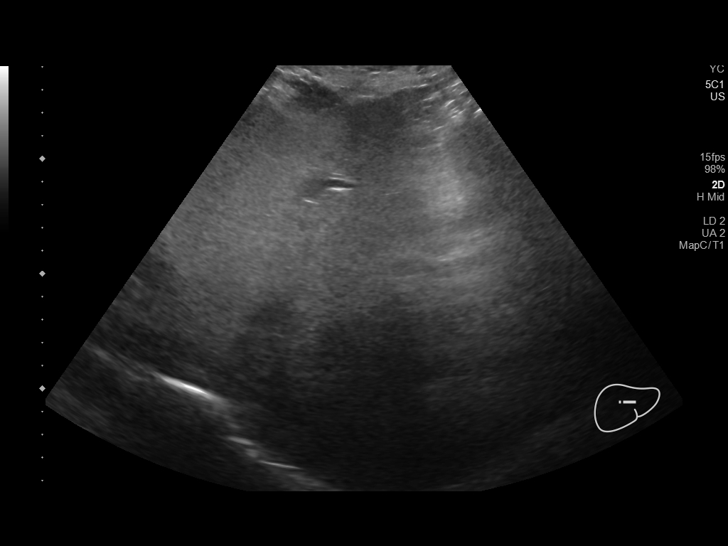
[im 141/154]
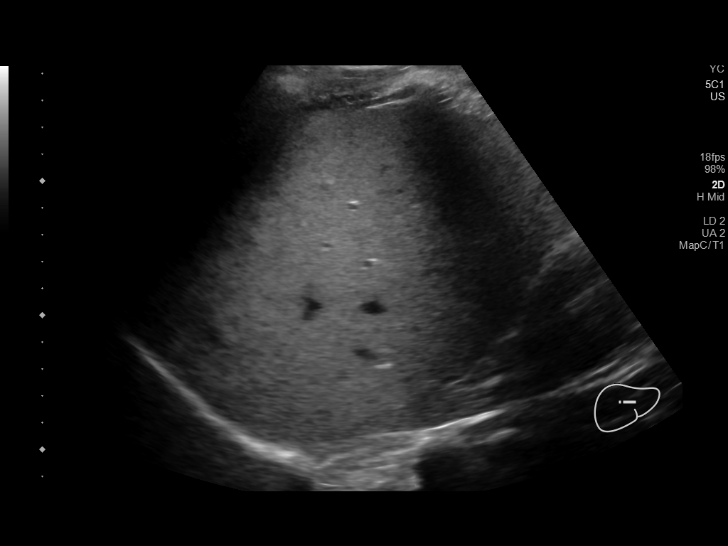
[im 154/154]
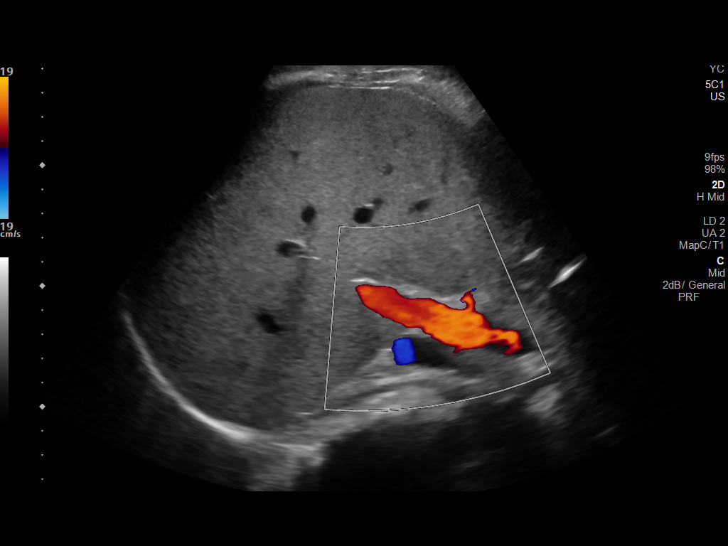

[14 of 25 positions shown; findings below may reference images not displayed]

FINDINGS: Gallbladder:

Normally distended without stones or wall thickening. No
pericholecystic fluid or sonographic Murphy sign.

Common bile duct:

Diameter: 5 mm diameter, normal

Liver:

Echogenic parenchyma, likely fatty infiltration though this can be
seen with cirrhosis and certain infiltrative disorders. No focal
hepatic mass. Slightly lobulated appearance of the LEFT lobe though
no definite cirrhotic features are seen. Portal vein is patent on
color Doppler imaging with normal direction of blood flow towards
the liver.

No RIGHT upper quadrant free fluid.
IMPRESSION: Fatty infiltration of liver.

Otherwise negative exam.

## 2021-08-09 DIAGNOSIS — F1420 Cocaine dependence, uncomplicated: Secondary | ICD-10-CM | POA: Insufficient documentation

## 2021-11-21 ENCOUNTER — Encounter: Payer: Self-pay | Admitting: Gastroenterology

## 2022-04-09 DIAGNOSIS — I3139 Other pericardial effusion (noninflammatory): Secondary | ICD-10-CM | POA: Diagnosis not present

## 2022-04-09 DIAGNOSIS — I517 Cardiomegaly: Secondary | ICD-10-CM | POA: Diagnosis not present

## 2023-05-08 ENCOUNTER — Ambulatory Visit: Payer: 59 | Admitting: Family Medicine

## 2023-06-19 ENCOUNTER — Ambulatory Visit: Payer: 59 | Admitting: Family Medicine

## 2023-07-30 ENCOUNTER — Ambulatory Visit: Admitting: Family Medicine

## 2023-10-07 ENCOUNTER — Encounter: Payer: Self-pay | Admitting: Family Medicine

## 2023-10-13 ENCOUNTER — Ambulatory Visit: Payer: Self-pay

## 2023-10-30 ENCOUNTER — Ambulatory Visit (INDEPENDENT_AMBULATORY_CARE_PROVIDER_SITE_OTHER)

## 2023-10-30 VITALS — BP 138/94 | HR 103 | Temp 97.6°F | Resp 16 | Ht 74.0 in | Wt 226.6 lb

## 2023-10-30 DIAGNOSIS — Z0000 Encounter for general adult medical examination without abnormal findings: Secondary | ICD-10-CM | POA: Diagnosis not present

## 2023-10-30 DIAGNOSIS — Z13228 Encounter for screening for other metabolic disorders: Secondary | ICD-10-CM

## 2023-10-30 DIAGNOSIS — Z1159 Encounter for screening for other viral diseases: Secondary | ICD-10-CM

## 2023-10-30 DIAGNOSIS — Z130 Encounter for screening for diseases of the blood and blood-forming organs and certain disorders involving the immune mechanism: Secondary | ICD-10-CM

## 2023-10-30 DIAGNOSIS — Z136 Encounter for screening for cardiovascular disorders: Secondary | ICD-10-CM

## 2023-10-30 DIAGNOSIS — Z13220 Encounter for screening for lipoid disorders: Secondary | ICD-10-CM

## 2023-10-30 DIAGNOSIS — I10 Essential (primary) hypertension: Secondary | ICD-10-CM

## 2023-10-30 DIAGNOSIS — Z131 Encounter for screening for diabetes mellitus: Secondary | ICD-10-CM | POA: Diagnosis not present

## 2023-10-30 DIAGNOSIS — E785 Hyperlipidemia, unspecified: Secondary | ICD-10-CM

## 2023-10-30 DIAGNOSIS — Z7689 Persons encountering health services in other specified circumstances: Secondary | ICD-10-CM

## 2023-10-30 DIAGNOSIS — Z114 Encounter for screening for human immunodeficiency virus [HIV]: Secondary | ICD-10-CM

## 2023-10-30 LAB — POCT GLYCOSYLATED HEMOGLOBIN (HGB A1C): Hemoglobin A1C: 5.5 % (ref 4.0–5.6)

## 2023-10-30 MED ORDER — ATORVASTATIN CALCIUM 40 MG PO TABS: 40.0000 mg | ORAL_TABLET | Freq: Every day | ORAL | 3 refills | Status: DC

## 2023-10-30 MED ORDER — AMLODIPINE BESYLATE 5 MG PO TABS: 5.0000 mg | ORAL_TABLET | Freq: Every day | ORAL | 3 refills | Status: DC
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
## 2023-10-31 LAB — COMPREHENSIVE METABOLIC PANEL WITH GFR
ALT: 30 IU/L (ref 0–44)
AST: 25 IU/L (ref 0–40)
Albumin: 4.4 g/dL (ref 3.8–4.9)
Alkaline Phosphatase: 108 IU/L (ref 44–121)
BUN/Creatinine Ratio: 16 (ref 9–20)
BUN: 13 mg/dL (ref 6–24)
Bilirubin Total: 0.3 mg/dL (ref 0.0–1.2)
CO2: 20 mmol/L (ref 20–29)
Calcium: 9.3 mg/dL (ref 8.7–10.2)
Chloride: 105 mmol/L (ref 96–106)
Creatinine, Ser: 0.83 mg/dL (ref 0.76–1.27)
Globulin, Total: 2.7 g/dL (ref 1.5–4.5)
Glucose: 90 mg/dL (ref 70–99)
Potassium: 4 mmol/L (ref 3.5–5.2)
Sodium: 142 mmol/L (ref 134–144)
Total Protein: 7.1 g/dL (ref 6.0–8.5)
eGFR: 101 mL/min/1.73 (ref >59)

## 2023-10-31 LAB — LIPID PANEL
Chol/HDL Ratio: 5.5 ratio — ABNORMAL HIGH (ref 0.0–5.0)
Cholesterol, Total: 191 mg/dL (ref 100–199)
HDL: 35 mg/dL — ABNORMAL LOW (ref >39)
LDL Chol Calc (NIH): 91 mg/dL (ref 0–99)
Triglycerides: 396 mg/dL — ABNORMAL HIGH (ref 0–149)
VLDL Cholesterol Cal: 65 mg/dL — ABNORMAL HIGH (ref 5–40)

## 2023-10-31 LAB — CBC
Hematocrit: 44 % (ref 37.5–51.0)
Hemoglobin: 13.9 g/dL (ref 13.0–17.7)
MCH: 30.2 pg (ref 26.6–33.0)
MCHC: 31.6 g/dL (ref 31.5–35.7)
MCV: 95 fL (ref 79–97)
Platelets: 282 x10E3/uL (ref 150–450)
RBC: 4.61 x10E6/uL (ref 4.14–5.80)
RDW: 12.7 % (ref 11.6–15.4)
WBC: 9.8 x10E3/uL (ref 3.4–10.8)

## 2023-10-31 LAB — HEPATITIS C ANTIBODY: Hep C Virus Ab: NONREACTIVE

## 2023-10-31 LAB — PSA: Prostate Specific Ag, Serum: 1.4 ng/mL (ref 0.0–4.0)

## 2023-10-31 LAB — TSH: TSH: 1.76 u[IU]/mL (ref 0.450–4.500)

## 2023-10-31 LAB — HIV ANTIBODY (ROUTINE TESTING W REFLEX): HIV Screen 4th Generation wRfx: NONREACTIVE

## 2023-11-04 ENCOUNTER — Ambulatory Visit: Payer: Self-pay

## 2023-11-04 NOTE — Progress Notes (Signed)
 I wanted to review your test results.  Hepatitis C and HIV screening tests were negative. You do NOT have either of the diseases.  PSA which looks at the health of your prostate is within Normal range.  TSH which looks at your thyroid levels is within Normal range.  Comprehensive metabolic panel which looks at your electrolytes, kidney and liver health shows all values within Normal ranges. CBC which looks at your blood count shows all values within Normal ranges. Lipid panel which looks at your cholesterol shows elevated triglycerides but LDL (bad cholesterol) levels are within Normal ranges. Plan to continue on atorvastatin  40mg .  Try to avoid or limit intake of fatty meats (beef, pork, lamb), butter, cheese, whole milk, fried foods, processed foods (e.g., cookies, cakes, pastries).  Take Care!

## 2023-11-18 ENCOUNTER — Ambulatory Visit

## 2023-12-02 ENCOUNTER — Ambulatory Visit (INDEPENDENT_AMBULATORY_CARE_PROVIDER_SITE_OTHER)

## 2023-12-02 VITALS — BP 128/78 | HR 102 | Temp 98.1°F | Resp 16 | Ht 74.0 in | Wt 222.0 lb

## 2023-12-02 DIAGNOSIS — I10 Essential (primary) hypertension: Secondary | ICD-10-CM | POA: Diagnosis not present

## 2023-12-02 DIAGNOSIS — E782 Mixed hyperlipidemia: Secondary | ICD-10-CM | POA: Diagnosis not present

## 2023-12-02 DIAGNOSIS — G5601 Carpal tunnel syndrome, right upper limb: Secondary | ICD-10-CM

## 2023-12-02 NOTE — Progress Notes (Signed)
     Patient ID: Daniel Silva, male    DOB: [birth year]  MRN: [id number]  CC: Medical Management of Chronic Issues (Patient c/o right shoulder hurting and tingling fingers at night)   Subjective: Daniel Silva is a 58 y.o. male who presents to clinic for follow up. Taking all medications as directed. Pt reports history of right hand tingling especially when he lays down at nighttime. Symptoms not present during the day. Denies weakness of hand.   ROS: Review of Systems Negative except as stated above  PHYSICAL EXAM: BP 128/78   Pulse (!) 102   Temp 98.1 F (36.7 C) (Oral)   Resp 16   Ht 6' 2 (1.88 m)   Wt 222 lb (100.7 kg)   SpO2 98%   BMI 28.50 kg/m   Physical Exam  General: well-appearing, no acute distress Skin: no jaundice, rashes, or lesions Cardiovascular: regular heart rate and rhythm, normal S1/S2, no murmurs, gallops, or rubs, peripheral pulses 2+ bilaterally Chest: no skeletal deformity, lungs clear to auscultation bilaterally, equal breath sounds bilaterally Musculoskeletal: Right hand positive tinel test, right hand positive phalen's test Extremities: no peripheral edema   ASSESSMENT AND PLAN:  1. Essential hypertension (Primary) - BP is 128/78 today which is at goal. - Continue amlodipine  5 mg daily - Recommended decreasing alcohol intake, pt reports he drinks alcohol daily.  2. Mixed hyperlipidemia - Continue atorvastatin  40mg  tablet daily - Plan to recheck lipid panel next visit.   3. Carpal tunnel syndrome of right wrist - Recommended use of cock-up wrist brace at night time    Patient was given the opportunity to ask questions.  Patient verbalized understanding of the plan and was able to repeat key elements of the plan.   Return in about 1 month (around 01/01/2024).  Sula Leavy Rode, PA-C

## 2023-12-02 NOTE — Patient Instructions (Addendum)
 Wear Cock-up wrist splint at night.

## 2024-01-01 ENCOUNTER — Telehealth: Payer: Self-pay

## 2024-01-01 ENCOUNTER — Ambulatory Visit

## 2024-01-01 NOTE — Telephone Encounter (Signed)
 Called pt and left vm to call office back to reschedule missed appt at [facility]

## 2024-01-23 ENCOUNTER — Ambulatory Visit

## 2024-01-23 ENCOUNTER — Telehealth: Payer: Self-pay

## 2024-01-23 NOTE — Telephone Encounter (Signed)
 Called pt and left vm to call office back to reschedule missed appt

## 2024-02-11 ENCOUNTER — Ambulatory Visit

## 2024-02-12 ENCOUNTER — Ambulatory Visit

## 2024-02-12 ENCOUNTER — Telehealth: Payer: Self-pay

## 2024-02-12 NOTE — Telephone Encounter (Signed)
 Called pt and left vm to call office back to reschedule missed appt

## 2024-02-17 ENCOUNTER — Telehealth: Admitting: Family Medicine

## 2024-02-17 DIAGNOSIS — K047 Periapical abscess without sinus: Secondary | ICD-10-CM

## 2024-02-17 MED ORDER — CLINDAMYCIN HCL 300 MG PO CAPS: 300.0000 mg | ORAL_CAPSULE | Freq: Three times a day (TID) | ORAL | 0 refills | Status: AC

## 2024-02-17 NOTE — Patient Instructions (Signed)
 Dental Abscess  A dental abscess is an infection around a tooth that may involve pain, swelling, and a collection of pus, as well as other symptoms. Treatment is important to help with symptoms and to prevent the infection from spreading. The general types of dental abscesses are: Pulpal abscess. This abscess may form from the inner part of the tooth (pulp). Periodontal abscess. This abscess may form from the gum. What are the causes? This condition is caused by a bacterial infection in or around the tooth. It may result from: Severe tooth decay (cavities). Trauma to the tooth, such as a broken or chipped tooth. What increases the risk? This condition is more likely to develop in males. It is also more likely to develop in people who: Have cavities. Have severe gum disease. Eat sugary snacks between meals. Use tobacco products. Have diabetes. Have a weakened disease-fighting system (immune system). Do not brush and care for their teeth regularly. What are the signs or symptoms? Mild symptoms of this condition include: Tenderness. Bad breath. Fever. A bitter taste in the mouth. Pain in and around the infected tooth. Moderate symptoms of this condition include: Swollen neck glands. Chills. Pus drainage. Swelling and redness around the infected tooth, in the mouth, or in the face. Severe pain in and around the infected tooth. Severe symptoms of this condition include: Difficulty swallowing. Difficulty opening the mouth. Nausea. Vomiting. How is this diagnosed? This condition is diagnosed based on: Your symptoms and your medical and dental history. An examination of the infected tooth. During the exam, your dental care provider may tap on the infected tooth. You may also need to have X-rays taken of the affected area. How is this treated? This condition is treated by getting rid of the infection. This may be done with: Antibiotic medicines. These may be used in certain  situations. Antibacterial mouth rinse. Incision and drainage. This procedure is done by making an incision in the abscess to drain out the pus. Removing pus is the first priority in treating an abscess. A root canal. This may be performed to save the tooth. Your dental care provider accesses the visible part of your tooth (crown) with a drill and removes any infected pulp. Then the space is filled and sealed off. Tooth extraction. The tooth is pulled out if it cannot be saved by other treatment. You may also receive treatment for pain, such as: Acetaminophen or NSAIDs. Gels that contain a numbing medicine. An injection to block the pain near your nerve. Follow these instructions at home: Medicines Take over-the-counter and prescription medicines only as told by your dental care provider. If you were prescribed an antibiotic, take it as told by your dental care provider. Do not stop taking the antibiotic even if you start to feel better. If you were prescribed a gel that contains a numbing medicine, use it exactly as told in the directions. Do not use these gels for children who are younger than 80 years of age. Use an antibacterial mouth rinse as told by your dental care provider. General instructions  Gargle with a mixture of salt and water 3-4 times a day or as needed. To make salt water, completely dissolve -1 tsp (3-6 g) of salt in 1 cup (237 mL) of warm water. Eat a soft diet while your abscess is healing. Drink enough fluid to keep your urine pale yellow. Do not apply heat to the outside of your mouth. Do not use any products that contain nicotine or tobacco. These  products include cigarettes, chewing tobacco, and vaping devices, such as e-cigarettes. If you need help quitting, ask your dental care provider. Keep all follow-up visits. This is important. How is this prevented?  Excellent dental home care, which includes brushing your teeth every morning and night with fluoride  toothpaste. Floss one time each day. Get regularly scheduled dental cleanings. Consider having a dental sealant applied on teeth that have deep grooves to prevent cavities. Drink fluoridated water regularly. This includes most tap water. Check the label on bottled water to see if it contains fluoride. Reduce or eliminate sugary drinks. Eat healthy meals and snacks. Wear a mouth guard or face shield to protect your teeth while playing sports. Contact a health care provider if: Your pain is worse and is not helped by medicine. You have swelling. You see pus around the tooth. You have a fever or chills. Get help right away if: Your symptoms suddenly get worse. You have a very bad headache. You have problems breathing or swallowing. You have trouble opening your mouth. You have swelling in your neck or around your eye. These symptoms may represent a serious problem that is an emergency. Do not wait to see if the symptoms will go away. Get medical help right away. Call your local emergency services (911 in the [place] Do not drive yourself to the hospital. Summary A dental abscess is a collection of pus in or around a tooth that results from an infection. A dental abscess may result from severe tooth decay, trauma to the tooth, or severe gum disease around a tooth. Symptoms include severe pain, swelling, redness, and drainage of pus in and around the infected tooth. The first priority in treating a dental abscess is to drain out the pus. Treatment may also involve removing damage inside the tooth (root canal) or extracting the tooth. This information is not intended to replace advice given to you by your health care provider. Make sure you discuss any questions you have with your health care provider. Document Revised: 05/04/2020 Document Reviewed: 05/04/2020 Elsevier Patient Education  2024 [employer].

## 2024-02-17 NOTE — Progress Notes (Signed)
 Virtual Visit Consent   Daniel Silva, you are scheduled for a virtual visit with a [facility] provider today. Just as with appointments in the office, your consent must be obtained to participate. Your consent will be active for this visit and any virtual visit you may have with one of our providers in the next 365 days. If you have a MyChart account, a copy of this consent can be sent to you electronically.  As this is a virtual visit, video technology does not allow for your provider to perform a traditional examination. This may limit your provider's ability to fully assess your condition. If your provider identifies any concerns that need to be evaluated in person or the need to arrange testing (such as labs, EKG, etc.), we will make arrangements to do so. Although advances in technology are sophisticated, we cannot ensure that it will always work on either your end or our end. If the connection with a video visit is poor, the visit may have to be switched to a telephone visit. With either a video or telephone visit, we are not always able to ensure that we have a secure connection.  By engaging in this virtual visit, you consent to the provision of healthcare and authorize for your insurance to be billed (if applicable) for the services provided during this visit. Depending on your insurance coverage, you may receive a charge related to this service.  I need to obtain your verbal consent now. Are you willing to proceed with your visit today? Daniel Silva has provided verbal consent on 02/17/2024 for a virtual visit (video or telephone). Loa Lamp, FNP  Date: 02/17/2024 6:46 PM   Virtual Visit via Video Note   I, Loa Lamp, connected with  Daniel Silva  ([id number], 1965/11/01) on 02/17/24 at  6:45 PM EST by a video-enabled telemedicine application and verified that I am speaking with the correct person using two identifiers.  Location: Patient: Virtual Visit Location Patient:  Home Provider: Virtual Visit Location Provider: Home Office   I discussed the limitations of evaluation and management by telemedicine and the availability of in person appointments. The patient expressed understanding and agreed to proceed.    History of Present Illness: Daniel Silva is a 58 y.o. who identifies as a male who was assigned male at birth, and is being seen today for left sided dental pain and swelling, no fever. Sx for 3 days with appmtt with dentist next week. SABRA  HPI: HPI  Problems:  Patient Active Problem List   Diagnosis Date Noted   Cocaine dependence, uncomplicated (HCC) 08/09/2021   Pneumonia 10/20/2020   Elevated prostate specific antigen (PSA) 02/16/2019   Male hypogonadism 02/16/2019   Erectile dysfunction 05/31/2018   Essential hypertension 05/31/2018   Low testosterone  in male 05/31/2018   Cocaine use 03/02/2013   Alcohol abuse 11/17/2012    Allergies: No Known Allergies Medications:  Current Outpatient Medications:    clindamycin  (CLEOCIN ) 300 MG capsule, Take 1 capsule (300 mg total) by mouth 3 (three) times daily for 10 days., Disp: 30 capsule, Rfl: 0   amLODipine  (NORVASC ) 5 MG tablet, Take 1 tablet (5 mg total) by mouth daily., Disp: 90 tablet, Rfl: 3   atorvastatin  (LIPITOR) 40 MG tablet, Take 1 tablet (40 mg total) by mouth daily., Disp: 90 tablet, Rfl: 3   sildenafil  (VIAGRA ) 100 MG tablet, Take 0.5 tablets (50 mg total) by mouth daily as needed for erectile dysfunction., Disp: 30 tablet, Rfl: 6  Observations/Objective: Patient is  well-developed, well-nourished in no acute distress.  Resting comfortably  at home.  Head is normocephalic, atraumatic.  No labored breathing.  Speech is clear and coherent with logical content.  Patient is alert and oriented at baseline.    Assessment and Plan: 1. Dental infection (Primary)  Warm salt water rinses, ibuprofen as directed, UC as needed.   Follow Up Instructions: I discussed the assessment and  treatment plan with the patient. The patient was provided an opportunity to ask questions and all were answered. The patient agreed with the plan and demonstrated an understanding of the instructions.  A copy of instructions were sent to the patient via MyChart unless otherwise noted below.     The patient was advised to call back or seek an in-person evaluation if the symptoms worsen or if the condition fails to improve as anticipated.    Jazalynn Mireles, FNP

## 2024-02-19 ENCOUNTER — Ambulatory Visit

## 2024-02-19 ENCOUNTER — Telehealth: Payer: Self-pay

## 2024-02-19 NOTE — Telephone Encounter (Signed)
 Called patient about missed appt on 12/11. Patient voicemail is full, can not leave message.

## 2024-02-25 ENCOUNTER — Ambulatory Visit

## 2024-03-01 ENCOUNTER — Ambulatory Visit

## 2024-03-01 VITALS — BP 129/87 | HR 87 | Temp 98.5°F | Resp 16 | Ht 74.0 in | Wt 200.8 lb

## 2024-03-01 DIAGNOSIS — G5601 Carpal tunnel syndrome, right upper limb: Secondary | ICD-10-CM

## 2024-03-01 DIAGNOSIS — N529 Male erectile dysfunction, unspecified: Secondary | ICD-10-CM | POA: Diagnosis not present

## 2024-03-01 DIAGNOSIS — I10 Essential (primary) hypertension: Secondary | ICD-10-CM | POA: Diagnosis not present

## 2024-03-01 DIAGNOSIS — E785 Hyperlipidemia, unspecified: Secondary | ICD-10-CM | POA: Diagnosis not present

## 2024-03-01 MED ORDER — AMLODIPINE BESYLATE 5 MG PO TABS: 5.0000 mg | ORAL_TABLET | Freq: Every day | ORAL | 2 refills | Status: AC

## 2024-03-01 MED ORDER — SILDENAFIL CITRATE 100 MG PO TABS: 50.0000 mg | ORAL_TABLET | Freq: Every day | ORAL | 2 refills | Status: AC | PRN

## 2024-03-01 MED ORDER — ATORVASTATIN CALCIUM 40 MG PO TABS: 40.0000 mg | ORAL_TABLET | Freq: Every day | ORAL | 2 refills | Status: AC

## 2024-03-01 NOTE — Progress Notes (Unsigned)
" ° ° ° °  Patient ID: Daniel Silva, male    DOB: 1965-09-28  MRN: [id number]  CC: Medical Management of Chronic Issues (Patient concern with tingling in his fingers at times. Patient also has BP concerns.)   Subjective: Daniel Silva is a 58 y.o. male with past medical history of hypertension who presents to clinic for follow-up/  Patient reports 2-week history of cold symptoms including runny nose, coughing up phlegm but reports he is feeling overall better. Reports he would like to restart viagra , has difficulty with both obtaining and maintaining an erection.  Patient reports continued right hand numbness that is worse during sleep and in the morning.  Ports he has not tried the wrist brace that was recommended to him last visit.  Allergies[1]  ROS: Review of Systems Negative except as stated above  PHYSICAL EXAM: BP 129/87   Pulse 87   Temp 98.5 F (36.9 C) (Oral)   Resp 16   Ht 6' 2 (1.88 m)   Wt 200 lb 12.8 oz (91.1 kg)   SpO2 95%   BMI 25.78 kg/m   Physical Exam  General: well-appearing, no acute distress Skin: no jaundice, rashes, or lesions Cardiovascular: regular heart rate and rhythm, normal S1/S2, no murmurs, gallops, or rubs, peripheral pulses 2+ bilaterally Chest: no skeletal deformity, lungs clear to auscultation bilaterally, equal breath sounds bilaterally Musculoskeletal: normal gait Extremities: no peripheral edema  ASSESSMENT AND PLAN:  1. Essential hypertension (Primary) BP: 129/87, at goal - Continue amLODipine  (NORVASC ) 5 MG tablet; Take 1 tablet (5 mg total) by mouth daily.  Dispense: 90 tablet; Refill: 2  2. Hyperlipidemia, unspecified hyperlipidemia type - Lipid panel - Continue atorvastatin  (LIPITOR) 40 MG tablet; Take 1 tablet (40 mg total) by mouth daily.  Dispense: 90 tablet; Refill: 2  3. Erectile dysfunction, unspecified erectile dysfunction type - Restart sildenafil  (VIAGRA ) 100 MG tablet; Take 0.5 tablets (50 mg total) by mouth daily as  needed for erectile dysfunction.  Dispense: 90 tablet; Refill: 2  4. Carpal tunnel syndrome of right wrist -Recommended to try cock-up wrist splint during sleeping hours to minimize symptoms and allow for decreased inflammation.   Patient was given the opportunity to ask questions.  Patient verbalized understanding of the plan and was able to repeat key elements of the plan.    Orders Placed This Encounter  Procedures   Lipid panel     Requested Prescriptions   Signed Prescriptions Disp Refills   amLODipine  (NORVASC ) 5 MG tablet 90 tablet 2    Sig: Take 1 tablet (5 mg total) by mouth daily.   atorvastatin  (LIPITOR) 40 MG tablet 90 tablet 2    Sig: Take 1 tablet (40 mg total) by mouth daily.   sildenafil  (VIAGRA ) 100 MG tablet 90 tablet 2    Sig: Take 0.5 tablets (50 mg total) by mouth daily as needed for erectile dysfunction.    Return in about 3 months (around 05/30/2024) for follow-up blood pressure.  Sula Cower Ezequiel Macauley, PA-C      [1] No Known Allergies  "

## 2024-03-02 ENCOUNTER — Ambulatory Visit: Payer: Self-pay

## 2024-03-02 LAB — LIPID PANEL
Chol/HDL Ratio: 3.6 ratio (ref 0.0–5.0)
Cholesterol, Total: 135 mg/dL (ref 100–199)
HDL: 37 mg/dL — ABNORMAL LOW
LDL Chol Calc (NIH): 54 mg/dL (ref 0–99)
Triglycerides: 278 mg/dL — ABNORMAL HIGH (ref 0–149)
VLDL Cholesterol Cal: 44 mg/dL — ABNORMAL HIGH (ref 5–40)

## 2024-03-23 ENCOUNTER — Ambulatory Visit

## 2024-03-26 ENCOUNTER — Ambulatory Visit

## 2024-05-31 ENCOUNTER — Ambulatory Visit: Payer: Self-pay
